# Patient Record
Sex: Male | Born: 1980 | Race: Black or African American | Hispanic: No | Marital: Single | Smoking: Current every day smoker
Health system: Southern US, Community
[De-identification: ages and names within clinical notes are randomized; demographics above are authoritative.]

## PROBLEM LIST (undated history)

## (undated) DIAGNOSIS — J45909 Unspecified asthma, uncomplicated: Secondary | ICD-10-CM

## (undated) DIAGNOSIS — F319 Bipolar disorder, unspecified: Secondary | ICD-10-CM

## (undated) HISTORY — PX: LUNG REMOVAL, PARTIAL: SHX233

## (undated) HISTORY — PX: LUNG SURGERY: SHX703

---
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
## 2010-04-10 ENCOUNTER — Ambulatory Visit: Payer: Self-pay | Admitting: Psychiatry

## 2010-04-10 ENCOUNTER — Other Ambulatory Visit: Payer: Self-pay | Admitting: Emergency Medicine

## 2010-04-10 ENCOUNTER — Inpatient Hospital Stay (HOSPITAL_COMMUNITY): Admission: AD | Admit: 2010-04-10 | Discharge: 2010-04-12 | Payer: Self-pay | Admitting: Psychiatry

## 2010-04-18 ENCOUNTER — Emergency Department (HOSPITAL_COMMUNITY): Admission: EM | Admit: 2010-04-18 | Discharge: 2010-04-18 | Payer: Self-pay | Admitting: Emergency Medicine

## 2010-05-28 ENCOUNTER — Ambulatory Visit: Payer: Self-pay | Admitting: Psychiatry

## 2010-09-26 LAB — CBC
HCT: 43.3 % (ref 39.0–52.0)
MCV: 90.7 fL (ref 78.0–100.0)
RDW: 13.6 % (ref 11.5–15.5)
WBC: 8.5 10*3/uL (ref 4.0–10.5)

## 2010-09-26 LAB — DIFFERENTIAL
Basophils Absolute: 0.1 10*3/uL (ref 0.0–0.1)
Eosinophils Relative: 1 % (ref 0–5)
Lymphocytes Relative: 27 % (ref 12–46)
Monocytes Absolute: 0.7 10*3/uL (ref 0.1–1.0)

## 2010-09-26 LAB — POCT I-STAT, CHEM 8
BUN: 9 mg/dL (ref 6–23)
Calcium, Ion: 1.14 mmol/L (ref 1.12–1.32)
Creatinine, Ser: 1.1 mg/dL (ref 0.4–1.5)
TCO2: 28 mmol/L (ref 0–100)

## 2010-09-26 LAB — GLUCOSE, CAPILLARY
Glucose-Capillary: 97 mg/dL (ref 70–99)
Glucose-Capillary: 98 mg/dL (ref 70–99)

## 2010-09-26 LAB — RAPID URINE DRUG SCREEN, HOSP PERFORMED
Barbiturates: NOT DETECTED
Benzodiazepines: NOT DETECTED
Cocaine: NOT DETECTED
Opiates: NOT DETECTED

## 2010-11-28 ENCOUNTER — Emergency Department (HOSPITAL_COMMUNITY)
Admission: EM | Admit: 2010-11-28 | Discharge: 2010-11-28 | Disposition: A | Discharge status: Home / Self Care | Payer: Medicaid - Out of State | Attending: Emergency Medicine | Admitting: Emergency Medicine

## 2010-11-28 DIAGNOSIS — J45909 Unspecified asthma, uncomplicated: Secondary | ICD-10-CM | POA: Insufficient documentation

## 2010-11-28 DIAGNOSIS — Z8659 Personal history of other mental and behavioral disorders: Secondary | ICD-10-CM | POA: Insufficient documentation

## 2010-11-28 DIAGNOSIS — F1010 Alcohol abuse, uncomplicated: Secondary | ICD-10-CM | POA: Insufficient documentation

## 2010-11-28 DIAGNOSIS — F319 Bipolar disorder, unspecified: Secondary | ICD-10-CM | POA: Insufficient documentation

## 2010-11-28 DIAGNOSIS — Z79899 Other long term (current) drug therapy: Secondary | ICD-10-CM | POA: Insufficient documentation

## 2010-11-28 LAB — COMPREHENSIVE METABOLIC PANEL
ALT: 18 U/L (ref 0–53)
AST: 31 U/L (ref 0–37)
CO2: 25 mEq/L (ref 19–32)
Calcium: 9.2 mg/dL (ref 8.4–10.5)
Chloride: 98 mEq/L (ref 96–112)
GFR calc Af Amer: >60 mL/min (ref >60)
GFR calc non Af Amer: >60 mL/min (ref >60)
Sodium: 135 mEq/L (ref 135–145)
Total Bilirubin: 1 mg/dL (ref 0.3–1.2)

## 2010-11-28 LAB — CBC
MCH: 30.1 pg (ref 26.0–34.0)
MCV: 88.3 fL (ref 78.0–100.0)
Platelets: 269 10*3/uL (ref 150–400)
RDW: 12.3 % (ref 11.5–15.5)

## 2010-11-28 LAB — DIFFERENTIAL
Eosinophils Absolute: 0.1 10*3/uL (ref 0.0–0.7)
Eosinophils Relative: 1 % (ref 0–5)
Lymphs Abs: 2.8 10*3/uL (ref 0.7–4.0)
Monocytes Absolute: 0.9 10*3/uL (ref 0.1–1.0)
Monocytes Relative: 10 % (ref 3–12)

## 2011-01-02 ENCOUNTER — Emergency Department (HOSPITAL_COMMUNITY)
Admission: EM | Admit: 2011-01-02 | Discharge: 2011-01-03 | Disposition: A | Discharge status: Home / Self Care | Payer: Medicare Other | Attending: Emergency Medicine | Admitting: Emergency Medicine

## 2011-01-02 DIAGNOSIS — R42 Dizziness and giddiness: Secondary | ICD-10-CM | POA: Insufficient documentation

## 2011-01-02 DIAGNOSIS — J45909 Unspecified asthma, uncomplicated: Secondary | ICD-10-CM | POA: Insufficient documentation

## 2011-01-03 LAB — CBC
Hemoglobin: 15.1 g/dL (ref 13.0–17.0)
MCHC: 35.1 g/dL (ref 30.0–36.0)
RDW: 12.2 % (ref 11.5–15.5)

## 2011-01-03 LAB — DIFFERENTIAL
Basophils Absolute: 0 10*3/uL (ref 0.0–0.1)
Basophils Relative: 0 % (ref 0–1)
Monocytes Absolute: 1 10*3/uL (ref 0.1–1.0)
Neutro Abs: 5.1 10*3/uL (ref 1.7–7.7)

## 2013-04-09 ENCOUNTER — Encounter (HOSPITAL_COMMUNITY): Payer: Self-pay | Admitting: Emergency Medicine

## 2013-04-09 ENCOUNTER — Emergency Department (HOSPITAL_COMMUNITY)
Admission: EM | Admit: 2013-04-09 | Discharge: 2013-04-09 | Discharge status: Against Medical Advice | Payer: Medicare Other | Attending: Emergency Medicine | Admitting: Emergency Medicine

## 2013-04-09 DIAGNOSIS — Z880 Allergy status to penicillin: Secondary | ICD-10-CM | POA: Insufficient documentation

## 2013-04-09 DIAGNOSIS — J069 Acute upper respiratory infection, unspecified: Secondary | ICD-10-CM | POA: Insufficient documentation

## 2013-04-09 DIAGNOSIS — R079 Chest pain, unspecified: Secondary | ICD-10-CM | POA: Insufficient documentation

## 2013-04-09 MED ORDER — ALBUTEROL SULFATE HFA 108 (90 BASE) MCG/ACT IN AERS
2.0000 | INHALATION_SPRAY | RESPIRATORY_TRACT | Status: DC | PRN
  Administered 2013-04-09: 2 via RESPIRATORY_TRACT
  Filled 2013-04-09: qty 6.7

## 2013-04-09 NOTE — ED Notes (Signed)
Patient states "I feel better I am ready to go, I dont want any more test done on me".  Patient made aware of risk leaving against medical advice and agreed to still leaving.  Patient stable at discharge.

## 2013-04-09 NOTE — ED Provider Notes (Signed)
CSN: [id number]     Arrival date & time 04/09/13  1142 History   First MD Initiated Contact with Patient 04/09/13 1145     Chief Complaint  Patient presents with  . Chest Pain   (Consider location/radiation/quality/duration/timing/severity/associated sxs/prior Treatment) Patient is a 32 y.o. male presenting with chest pain. The history is provided by the patient.  Chest Pain Pain location:  L chest Associated symptoms: cough   Associated symptoms: no abdominal pain, no back pain, no headache, no nausea, no numbness, no shortness of breath, not vomiting and no weakness    patient developed left-sided chest pain while running today. EMS was called and the pain improved with nitroglycerin. It is dull on the left side. His heart rate was going fast but states it was going fast because the run. No fevers. He's had a cough with some green sputum. He does run normally does not have a problem with it. No weight loss. He states he previously had surgery on his lung when he was a child. He states he was a little sweaty. Patient states he was wheezing also  History reviewed. No pertinent past medical history. History reviewed. No pertinent past surgical history. No family history on file. History  Substance Use Topics  . Smoking status: Never Smoker   . Smokeless tobacco: Not on file  . Alcohol Use: No    Review of Systems  Constitutional: Negative for activity change and appetite change.  HENT: Negative for neck stiffness.   Eyes: Negative for pain.  Respiratory: Positive for cough. Negative for chest tightness and shortness of breath.   Cardiovascular: Positive for chest pain. Negative for leg swelling.  Gastrointestinal: Negative for nausea, vomiting, abdominal pain and diarrhea.  Genitourinary: Negative for flank pain.  Musculoskeletal: Negative for back pain.  Skin: Negative for rash.  Neurological: Negative for weakness, numbness and headaches.  Psychiatric/Behavioral: Negative for  behavioral problems.    Allergies  Penicillins  Home Medications  No current outpatient prescriptions on file. BP 105/72  Temp(Src) 98.5 F (36.9 C) (Oral)  Resp 16  SpO2 100% Physical Exam  Nursing note and vitals reviewed. Constitutional: He is oriented to person, place, and time. He appears well-developed and well-nourished.  HENT:  Head: Normocephalic and atraumatic.  Eyes: EOM are normal. Pupils are equal, round, and reactive to light.  Neck: Normal range of motion. Neck supple.  Cardiovascular: Normal rate, regular rhythm and normal heart sounds.   No murmur heard. Pulmonary/Chest: Effort normal. He has wheezes.  Mild wheezes on right. More severe wheezes with harshness on the left.  Abdominal: Soft. Bowel sounds are normal. He exhibits no distension and no mass. There is no tenderness. There is no rebound and no guarding.  Musculoskeletal: Normal range of motion. He exhibits no edema.  Neurological: He is alert and oriented to person, place, and time. No cranial nerve deficit.  Skin: Skin is warm and dry.  Psychiatric: He has a normal mood and affect.    ED Course  Procedures (including critical care time) Labs Review Labs Reviewed  POCT I-STAT TROPONIN I   Imaging Review No results found.  Date: 04/09/2013  Rate: 55  Rhythm: sinus bradycardia  QRS Axis: normal  Intervals: normal  ST/T Wave abnormalities: early repolarization  Conduction Disutrbances:LVH with repol  Narrative Interpretation:   Old EKG Reviewed: none available   MDM   1. Chest pain   2. URI (upper respiratory infection)     patient with chest pain. It does have  some wheezes and focal lung sounds. Patient is not willing to stay for the x-ray. His leaving AMA. Patient was informed of the risks.    Juliet Rude. Rubin Payor, MD 04/09/13 [id number]

## 2013-04-09 NOTE — ED Notes (Signed)
Patient from home via [facility] c/o 10/10 left sided chest pain that radiates to his back and shoulder blades.  Patient's chest is tender to touch.  Patient received 2 nitro and 324 of asprin PTA patient now c/o 5/10 chest pain. Patient states having a productive cough but denies fever.

## 2013-06-13 ENCOUNTER — Encounter (HOSPITAL_COMMUNITY): Payer: Self-pay | Admitting: Emergency Medicine

## 2013-06-13 ENCOUNTER — Emergency Department (HOSPITAL_COMMUNITY)
Admission: EM | Admit: 2013-06-13 | Discharge: 2013-06-13 | Disposition: A | Discharge status: Home / Self Care | Payer: Medicare Other | Attending: Emergency Medicine | Admitting: Emergency Medicine

## 2013-06-13 DIAGNOSIS — Z880 Allergy status to penicillin: Secondary | ICD-10-CM | POA: Insufficient documentation

## 2013-06-13 DIAGNOSIS — Z202 Contact with and (suspected) exposure to infections with a predominantly sexual mode of transmission: Secondary | ICD-10-CM | POA: Insufficient documentation

## 2013-06-13 DIAGNOSIS — R369 Urethral discharge, unspecified: Secondary | ICD-10-CM | POA: Insufficient documentation

## 2013-06-13 DIAGNOSIS — Z792 Long term (current) use of antibiotics: Secondary | ICD-10-CM | POA: Insufficient documentation

## 2013-06-13 DIAGNOSIS — Z711 Person with feared health complaint in whom no diagnosis is made: Secondary | ICD-10-CM

## 2013-06-13 LAB — URINALYSIS, ROUTINE W REFLEX MICROSCOPIC
Bilirubin Urine: NEGATIVE
Glucose, UA: NEGATIVE mg/dL
Hgb urine dipstick: NEGATIVE
Ketones, ur: NEGATIVE mg/dL
Nitrite: NEGATIVE
Protein, ur: NEGATIVE mg/dL
Specific Gravity, Urine: 1.019 (ref 1.005–1.030)
Urobilinogen, UA: 1 mg/dL (ref 0.0–1.0)
pH: 7.5 (ref 5.0–8.0)

## 2013-06-13 LAB — URINE MICROSCOPIC-ADD ON

## 2013-06-13 MED ORDER — METHOCARBAMOL 500 MG PO TABS: 500.0000 mg | ORAL_TABLET | Freq: Two times a day (BID) | ORAL | Status: DC

## 2013-06-13 MED ORDER — DOXYCYCLINE HYCLATE 100 MG PO CAPS: 100.0000 mg | ORAL_CAPSULE | Freq: Two times a day (BID) | ORAL | Status: DC

## 2013-06-13 MED ORDER — CEFTRIAXONE SODIUM 250 MG IJ SOLR
250.0000 mg | Freq: Once | INTRAMUSCULAR | Status: AC
  Administered 2013-06-13: 250 mg via INTRAMUSCULAR
  Filled 2013-06-13: qty 250

## 2013-06-13 MED ORDER — AZITHROMYCIN 250 MG PO TABS
1000.0000 mg | ORAL_TABLET | Freq: Once | ORAL | Status: AC
  Administered 2013-06-13: 1000 mg via ORAL
  Filled 2013-06-13: qty 4

## 2013-06-13 MED ORDER — IBUPROFEN 800 MG PO TABS: 800.0000 mg | ORAL_TABLET | Freq: Three times a day (TID) | ORAL | Status: DC

## 2013-06-13 NOTE — ED Notes (Signed)
Pt states he does not want doxycycline, he states he wants a shot of rocephin, states has had this before with not allergic reaction. Pt also requesting a pain medication. EDPA made aware.

## 2013-06-13 NOTE — ED Notes (Signed)
Pt. reports dysuria with left lower back pain for several days , denies injury or hematuria . No fever or chills.

## 2013-06-13 NOTE — ED Provider Notes (Signed)
CSN: [id number]     Arrival date & time 06/13/13  2041 History  This chart was scribed for non-physician practitioner, Dierdre Forth, PA-C working with Roney Marion, MD by Greggory Stallion, ED scribe. This patient was seen in room TR07C/TR07C and the patient's care was started at 10:34 PM.   Chief Complaint  Patient presents with  . Dysuria   The history is provided by the patient. No language interpreter was used.   HPI Comments: Gene Rivera is a 32 y.o. male who presents to the Emergency Department complaining of dysuria that started several days ago. Pt had unprotected sex with a new male partner girl 2 days ago. 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 He has tried no OTC treatments for his back pain.  Denies abdominal pain, nausea, emesis, testicular pain, penile pain, hematuria, saddle anesthesia, weakness or numbness in his legs, gait disturbance.    History reviewed. No pertinent past medical history. Past Surgical History  Procedure Laterality Date  . Lung surgery     No family history on file. History  Substance Use Topics  . Smoking status: Never Smoker   . Smokeless tobacco: Not on file  . Alcohol Use: No    Review of Systems  Constitutional: Negative for fever, chills, diaphoresis, appetite change, fatigue and unexpected weight change.  HENT: Negative for mouth sores.   Eyes: Negative for visual disturbance.  Respiratory: Negative for cough, chest tightness, shortness of breath and wheezing.   Cardiovascular: Negative for chest pain.  Gastrointestinal: Negative for nausea, vomiting, abdominal pain, diarrhea and constipation.  Endocrine: Negative for polydipsia, polyphagia and polyuria.  Genitourinary: Positive for dysuria and discharge. Negative for urgency, frequency, hematuria, penile pain and testicular pain.  Musculoskeletal: Negative for back pain and neck stiffness.  Skin:  Negative for rash.  Allergic/Immunologic: Negative for immunocompromised state.  Neurological: Negative for syncope, light-headedness and headaches.  Hematological: Does not bruise/bleed easily.  Psychiatric/Behavioral: Negative for sleep disturbance. The patient is not nervous/anxious.     Allergies  Penicillins  Home Medications   Current Outpatient Rx  Name  Route  Sig  Dispense  Refill  . doxycycline (VIBRAMYCIN) 100 MG capsule   Oral   Take 1 capsule (100 mg total) by mouth 2 (two) times daily.   20 capsule   0     BP 115/66  Pulse 62  Temp(Src) 98.5 F (36.9 C) (Oral)  Resp 20  Wt 152 lb (68.947 kg)  SpO2 100%  Physical Exam  Nursing note and vitals reviewed. Constitutional: He appears well-developed and well-nourished. No distress.  Awake, alert, nontoxic appearance  HENT:  Head: Normocephalic and atraumatic.  Mouth/Throat: Oropharynx is clear and moist. No oropharyngeal exudate.  Eyes: Conjunctivae are normal. No scleral icterus.  Neck: Normal range of motion. Neck supple.  Full ROM without pain  Cardiovascular: Normal rate, regular rhythm and intact distal pulses.   Pulmonary/Chest: Effort normal and breath sounds normal. No respiratory distress. He has no wheezes.  Abdominal: Soft. Bowel sounds are normal. He exhibits no distension and no mass. There is no tenderness. There is no rebound and no guarding. Hernia confirmed negative in the right inguinal area and confirmed negative in the left inguinal area.  Genitourinary: Testes normal. Right testis shows no mass, no swelling and no tenderness. Right testis is descended. Cremasteric reflex is not absent on the right side. Left testis shows no mass,  no swelling and no tenderness. Left testis is descended. Cremasteric reflex is not absent on the left side. Uncircumcised. No phimosis, paraphimosis, hypospadias, penile erythema or penile tenderness. Discharge (scant amount, opaque) found.  Musculoskeletal: Normal  range of motion. He exhibits no edema.  Full range of motion of the T-spine and L-spine No tenderness to palpation of the spinous processes of the T-spine or L-spine Mild tenderness to palpation of the paraspinous muscles of the T-spine  Lymphadenopathy:    He has no cervical adenopathy.       Right: No inguinal adenopathy present.       Left: No inguinal adenopathy present.  Neurological: He is alert. He has normal reflexes.  Speech is clear and goal oriented, follows commands Normal strength in upper and lower extremities bilaterally including dorsiflexion and plantar flexion, strong and equal grip strength Sensation normal to light and sharp touch Moves extremities without ataxia, coordination intact Normal gait Normal balance   Skin: Skin is warm and dry. No rash noted. He is not diaphoretic. No erythema.  Psychiatric: He has a normal mood and affect.   ED Course  Procedures (including critical care time)  DIAGNOSTIC STUDIES: Oxygen Saturation is 100% on RA, normal by my interpretation.    COORDINATION OF CARE: 10:37 PM-Discussed treatment plan which includes an antibiotic and ibuprofen with pt at bedside and pt agreed to plan.   Labs Review Labs Reviewed  URINALYSIS, ROUTINE W REFLEX MICROSCOPIC - Abnormal; Notable for the following:    Leukocytes, UA MODERATE (*)    All other components within normal limits  URINE MICROSCOPIC-ADD ON - Abnormal; Notable for the following:    Casts HYALINE CASTS (*)    All other components within normal limits  URINE CULTURE  GC/CHLAMYDIA PROBE AMP   Imaging Review No results found.  EKG Interpretation   None       MDM   1. Exposure to STD   2. Concern about STD in male without diagnosis      [name] presents with dysuria after unprotected sex with a new partner.  Presents for STD screen and dysuria.  STD cultures obtained including gonorrhea Chlamydia. Patient to be discharged with instructions to follow up with  PCP. Discussed importance of using protection when sexually active. Pt understands that they have GC/Chlamydia cultures pending and that they will need to inform all sexual partners if results return positive. Patient has been treated prophylactically with azithromycin. Patient with history of anaphylactic reaction to penicillin but states he's had Rocephin IM without any difficulty. Will minister here in the emergency department. Pt has been advised to not drink on this medication. I have also discussed reasons to return immediately to the ER. Patient expresses understanding and agrees with plan.  Patient also with back pain.  No neurological deficits and normal neuro exam.  Patient can walk but states is painful.  No loss of bowel or bladder control.  No concern for cauda equina.  No fever, night sweats, weight loss, h/o cancer, IVDU.  RICE protocol and pain medicine indicated and discussed with patient.   It has been determined that no acute conditions requiring further emergency intervention are present at this time. The patient/guardian have been advised of the diagnosis and plan. We have discussed signs and symptoms that warrant return to the ED, such as changes or worsening in symptoms.   Vital signs are stable at discharge.   BP 115/66  Pulse 62  Temp(Src) 98.5 F (36.9 C) (Oral)  Resp 20  Wt 152 lb (68.947 kg)  SpO2 100%  Patient/guardian has voiced understanding and agreed to follow-up with the PCP or specialist.    I personally performed the services described in this documentation, which was scribed in my presence. The recorded information has been reviewed and is accurate.    Dahlia Client Baylie Drakes, PA-C 06/13/13 2252  Dierdre Forth, PA-C 06/13/13 2302

## 2013-06-14 LAB — GC/CHLAMYDIA PROBE AMP
CT Probe RNA: NEGATIVE
GC Probe RNA: NEGATIVE

## 2013-06-15 LAB — URINE CULTURE: Culture: NO GROWTH

## 2013-06-23 NOTE — ED Provider Notes (Signed)
Medical screening examination/treatment/procedure(s) were performed by non-physician practitioner and as supervising physician I was immediately available for consultation/collaboration.  EKG Interpretation   None         Janayia Burggraf J Arben Packman, MD 06/23/13 0839 

## 2013-10-06 DIAGNOSIS — Z9119 Patient's noncompliance with other medical treatment and regimen: Secondary | ICD-10-CM | POA: Diagnosis not present

## 2013-10-06 DIAGNOSIS — F17200 Nicotine dependence, unspecified, uncomplicated: Secondary | ICD-10-CM | POA: Diagnosis present

## 2013-10-06 DIAGNOSIS — R45851 Suicidal ideations: Secondary | ICD-10-CM | POA: Diagnosis not present

## 2013-10-06 DIAGNOSIS — Z91199 Patient's noncompliance with other medical treatment and regimen due to unspecified reason: Secondary | ICD-10-CM | POA: Diagnosis not present

## 2013-10-06 DIAGNOSIS — IMO0002 Reserved for concepts with insufficient information to code with codable children: Secondary | ICD-10-CM | POA: Diagnosis not present

## 2013-10-06 DIAGNOSIS — F332 Major depressive disorder, recurrent severe without psychotic features: Secondary | ICD-10-CM | POA: Diagnosis not present

## 2013-10-06 DIAGNOSIS — F259 Schizoaffective disorder, unspecified: Secondary | ICD-10-CM | POA: Diagnosis present

## 2014-01-11 DIAGNOSIS — F331 Major depressive disorder, recurrent, moderate: Secondary | ICD-10-CM | POA: Diagnosis not present

## 2014-01-18 ENCOUNTER — Encounter (HOSPITAL_COMMUNITY): Payer: Self-pay | Admitting: Emergency Medicine

## 2014-01-18 ENCOUNTER — Emergency Department (HOSPITAL_COMMUNITY)
Admission: EM | Admit: 2014-01-18 | Discharge: 2014-01-18 | Disposition: A | Discharge status: Home / Self Care | Payer: Medicare Other | Attending: Emergency Medicine | Admitting: Emergency Medicine

## 2014-01-18 DIAGNOSIS — S79919A Unspecified injury of unspecified hip, initial encounter: Secondary | ICD-10-CM | POA: Diagnosis not present

## 2014-01-18 DIAGNOSIS — Z791 Long term (current) use of non-steroidal anti-inflammatories (NSAID): Secondary | ICD-10-CM | POA: Insufficient documentation

## 2014-01-18 DIAGNOSIS — Z79899 Other long term (current) drug therapy: Secondary | ICD-10-CM | POA: Diagnosis not present

## 2014-01-18 DIAGNOSIS — Z880 Allergy status to penicillin: Secondary | ICD-10-CM | POA: Diagnosis not present

## 2014-01-18 DIAGNOSIS — R031 Nonspecific low blood-pressure reading: Secondary | ICD-10-CM | POA: Diagnosis not present

## 2014-01-18 DIAGNOSIS — Y990 Civilian activity done for income or pay: Secondary | ICD-10-CM | POA: Diagnosis not present

## 2014-01-18 DIAGNOSIS — S79929A Unspecified injury of unspecified thigh, initial encounter: Principal | ICD-10-CM

## 2014-01-18 DIAGNOSIS — Z792 Long term (current) use of antibiotics: Secondary | ICD-10-CM | POA: Diagnosis not present

## 2014-01-18 DIAGNOSIS — Y929 Unspecified place or not applicable: Secondary | ICD-10-CM | POA: Insufficient documentation

## 2014-01-18 DIAGNOSIS — M25559 Pain in unspecified hip: Secondary | ICD-10-CM | POA: Diagnosis not present

## 2014-01-18 DIAGNOSIS — X500XXA Overexertion from strenuous movement or load, initial encounter: Secondary | ICD-10-CM | POA: Insufficient documentation

## 2014-01-18 DIAGNOSIS — Y9301 Activity, walking, marching and hiking: Secondary | ICD-10-CM | POA: Diagnosis not present

## 2014-01-18 DIAGNOSIS — M25552 Pain in left hip: Secondary | ICD-10-CM

## 2014-01-18 MED ORDER — IBUPROFEN 800 MG PO TABS: 800.0000 mg | ORAL_TABLET | Freq: Three times a day (TID) | ORAL | Status: DC

## 2014-01-18 MED ORDER — HYDROCODONE-ACETAMINOPHEN 5-325 MG PO TABS: 1.0000 | ORAL_TABLET | ORAL | Status: DC | PRN

## 2014-01-18 MED ORDER — CYCLOBENZAPRINE HCL 10 MG PO TABS: 10.0000 mg | ORAL_TABLET | Freq: Two times a day (BID) | ORAL | Status: DC | PRN

## 2014-01-18 NOTE — ED Notes (Signed)
Pt states he slipped in the walk in cooler at work this AM. Pt states he caught himself falling. Pt c/o pain to L hip. Pt states pain is getting worse. Pt ambulatory to exam room with steady gait.

## 2014-01-18 NOTE — Discharge Instructions (Signed)
Arthralgia Arthralgia is joint pain. A joint is a place where two bones meet. Joint pain can happen for many reasons. The joint can be bruised, stiff, infected, or weak from aging. Pain usually goes away after resting and taking medicine for soreness.  HOME CARE  Rest the joint as told by your doctor.  Keep the sore joint raised (elevated) for the first 24 hours.  Put ice on the joint area.  Put ice in a plastic bag.  Place a towel between your skin and the bag.  Leave the ice on for 15-20 minutes, 03-04 times a day.  Wear your splint, casting, elastic bandage, or sling as told by your doctor.  Only take medicine as told by your doctor. Do not take aspirin.  Use crutches as told by your doctor. Do not put weight on the joint until told to by your doctor. GET HELP RIGHT AWAY IF:   You have bruising, puffiness (swelling), or more pain.  Your fingers or toes turn blue or start to lose feeling (numb).  Your medicine does not lessen the pain.  Your pain becomes severe.  You have a temperature by mouth above 102 F (38.9 C), not controlled by medicine.  You cannot move or use the joint. MAKE SURE YOU:   Understand these instructions.  Will watch your condition.  Will get help right away if you are not doing well or get worse. Document Released: 06/18/2009 Document Revised: 09/22/2011 Document Reviewed: 06/18/2009 [facility] Patient Information 2015 [place], [place]. This information is not intended to replace advice given to you by your health care provider. Make sure you discuss any questions you have with your health care provider. Cryotherapy Cryotherapy means treatment with cold. Ice or gel packs can be used to reduce both pain and swelling. Ice is the most helpful within the first 24 to 48 hours after an injury or flareup from overusing a muscle or joint. Sprains, strains, spasms, burning pain, shooting pain, and aches can all be eased with ice. Ice can also be used when  recovering from surgery. Ice is effective, has very few side effects, and is safe for most people to use. PRECAUTIONS  Ice is not a safe treatment option for people with:  Raynaud's phenomenon. This is a condition affecting small blood vessels in the extremities. Exposure to cold may cause your problems to return.  Cold hypersensitivity. There are many forms of cold hypersensitivity, including:  Cold urticaria. Red, itchy hives appear on the skin when the tissues begin to warm after being iced.  Cold erythema. This is a red, itchy rash caused by exposure to cold.  Cold hemoglobinuria. Red blood cells break down when the tissues begin to warm after being iced. The hemoglobin that carry oxygen are passed into the urine because they cannot combine with blood proteins fast enough.  Numbness or altered sensitivity in the area being iced. If you have any of the following conditions, do not use ice until you have discussed cryotherapy with your caregiver:  Heart conditions, such as arrhythmia, angina, or chronic heart disease.  High blood pressure.  Healing wounds or open skin in the area being iced.  Current infections.  Rheumatoid arthritis.  Poor circulation.  Diabetes. Ice slows the blood flow in the region it is applied. This is beneficial when trying to stop inflamed tissues from spreading irritating chemicals to surrounding tissues. However, if you expose your skin to cold temperatures for too long or without the proper protection, you can damage  your skin or nerves. Watch for signs of skin damage due to cold. HOME CARE INSTRUCTIONS Follow these tips to use ice and cold packs safely.  Place a dry or damp towel between the ice and skin. A damp towel will cool the skin more quickly, so you may need to shorten the time that the ice is used.  For a more rapid response, add gentle compression to the ice.  Ice for no more than 10 to 20 minutes at a time. The bonier the area you are  icing, the less time it will take to get the benefits of ice.  Check your skin after 5 minutes to make sure there are no signs of a poor response to cold or skin damage.  Rest 20 minutes or more in between uses.  Once your skin is numb, you can end your treatment. You can test numbness by very lightly touching your skin. The touch should be so light that you do not see the skin dimple from the pressure of your fingertip. When using ice, most people will feel these normal sensations in this order: cold, burning, aching, and numbness.  Do not use ice on someone who cannot communicate their responses to pain, such as small children or people with dementia. HOW TO MAKE AN ICE PACK Ice packs are the most common way to use ice therapy. Other methods include ice massage, ice baths, and cryo-sprays. Muscle creams that cause a cold, tingly feeling do not offer the same benefits that ice offers and should not be used as a substitute unless recommended by your caregiver. To make an ice pack, do one of the following:  Place crushed ice or a bag of frozen vegetables in a sealable plastic bag. Squeeze out the excess air. Place this bag inside another plastic bag. Slide the bag into a pillowcase or place a damp towel between your skin and the bag.  Mix 3 parts water with 1 part rubbing alcohol. Freeze the mixture in a sealable plastic bag. When you remove the mixture from the freezer, it will be slushy. Squeeze out the excess air. Place this bag inside another plastic bag. Slide the bag into a pillowcase or place a damp towel between your skin and the bag. SEEK MEDICAL CARE IF:  You develop white spots on your skin. This may give the skin a blotchy (mottled) appearance.  Your skin turns blue or pale.  Your skin becomes waxy or hard.  Your swelling gets worse. MAKE SURE YOU:   Understand these instructions.  Will watch your condition.  Will get help right away if you are not doing well or get  worse. Document Released: 02/24/2011 Document Revised: 09/22/2011 Document Reviewed: 02/24/2011 [facility] Patient Information 2015 [place], [place]. This information is not intended to replace advice given to you by your health care provider. Make sure you discuss any questions you have with your health care provider.

## 2014-01-18 NOTE — ED Provider Notes (Signed)
CSN: [id number]     Arrival date & time 01/18/14  2111 History  This chart was scribed for non-physician practitioner, Elpidio AnisShari Dayson Aboud, PA-C,working with Juliet RudeNathan R. Rubin PayorPickering, MD, by Karle PlumberJennifer Tensley, ED Scribe.  This patient was seen in room WTR7/WTR7 and the patient's care was started at 9:31 PM.  Chief Complaint  Patient presents with  . Hip Pain   The history is provided by the patient. No language interpreter was used.   HPI Comments:  Gene Rivera is a 33 y.o. male who presents to the Emergency Department complaining of moderate left hip pain that started secondary to slipping and almost falling last night. He states he feels as if he pulled a muscle. He has not taken anything for pain. He denies numbness or abdominal pain. He denies fall, trauma, or other injury.   History reviewed. No pertinent past medical history. Past Surgical History  Procedure Laterality Date  . Lung surgery     No family history on file. History  Substance Use Topics  . Smoking status: Never Smoker   . Smokeless tobacco: Not on file  . Alcohol Use: No    Review of Systems  Gastrointestinal: Negative for abdominal pain.  Musculoskeletal: Positive for myalgias.  Neurological: Negative for numbness.  All other systems reviewed and are negative.   Allergies  Penicillins  Home Medications   Prior to Admission medications   Medication Sig Start Date End Date Taking? Authorizing Provider  doxycycline (VIBRAMYCIN) 100 MG capsule Take 1 capsule (100 mg total) by mouth 2 (two) times daily. 06/13/13   Hannah Muthersbaugh, PA-C  ibuprofen (ADVIL,MOTRIN) 800 MG tablet Take 1 tablet (800 mg total) by mouth 3 (three) times daily. 06/13/13   Hannah Muthersbaugh, PA-C  methocarbamol (ROBAXIN) 500 MG tablet Take 1 tablet (500 mg total) by mouth 2 (two) times daily. 06/13/13   Hannah Muthersbaugh, PA-C   Triage Vitals: BP 100/46  Pulse 70  Temp(Src) 98.9 F (37.2 C) (Oral)  Resp 16  SpO2 97% Physical Exam   Nursing note and vitals reviewed. Constitutional: He is oriented to person, place, and time. He appears well-developed and well-nourished.  HENT:  Head: Normocephalic and atraumatic.  Eyes: EOM are normal.  Neck: Normal range of motion.  Cardiovascular: Normal rate.   Pulmonary/Chest: Effort normal.  Abdominal: Soft. There is no tenderness.  Musculoskeletal: Normal range of motion. He exhibits tenderness. He exhibits no edema.  Left paralumbar tenderness. No swelling. No discoloration.  Neurological: He is alert and oriented to person, place, and time. He displays normal reflexes.  Skin: Skin is warm and dry.  Psychiatric: He has a normal mood and affect. His behavior is normal.    ED Course  Procedures (including critical care time) DIAGNOSTIC STUDIES: Oxygen Saturation is 97% on RA, normal by my interpretation.   COORDINATION OF CARE: 9:33 PM- Will treat pain. Pt verbalizes understanding and agrees to plan.  Medications - No data to display  Labs Review Labs Reviewed - No data to display  Imaging Review No results found.   EKG Interpretation None      MDM   Final diagnoses:  None    1. Musculoskeletal hip pain  No concern for fracture without traumatic injury. Tender to palpation, ambulatory with full weight bearing.   I personally performed the services described in this documentation, which was scribed in my presence. The recorded information has been reviewed and is accurate.    Arnoldo HookerShari A Ilayda Toda, PA-C 01/18/14 2204

## 2014-01-18 NOTE — ED Provider Notes (Signed)
Medical screening examination/treatment/procedure(s) were performed by non-physician practitioner and as supervising physician I was immediately available for consultation/collaboration.   EKG Interpretation None       Juliet RudeNathan R. Rubin PayorPickering, MD 01/18/14 [id number]

## 2014-02-22 ENCOUNTER — Encounter (HOSPITAL_COMMUNITY): Payer: Self-pay | Admitting: Emergency Medicine

## 2014-02-22 DIAGNOSIS — Z880 Allergy status to penicillin: Secondary | ICD-10-CM | POA: Diagnosis not present

## 2014-02-22 DIAGNOSIS — R1013 Epigastric pain: Secondary | ICD-10-CM | POA: Diagnosis not present

## 2014-02-22 DIAGNOSIS — K859 Acute pancreatitis without necrosis or infection, unspecified: Secondary | ICD-10-CM | POA: Insufficient documentation

## 2014-02-22 DIAGNOSIS — R109 Unspecified abdominal pain: Secondary | ICD-10-CM | POA: Diagnosis not present

## 2014-02-22 DIAGNOSIS — F17200 Nicotine dependence, unspecified, uncomplicated: Secondary | ICD-10-CM | POA: Insufficient documentation

## 2014-02-22 LAB — COMPREHENSIVE METABOLIC PANEL
ALT: 20 U/L (ref 0–53)
AST: 29 U/L (ref 0–37)
Albumin: 3.7 g/dL (ref 3.5–5.2)
Alkaline Phosphatase: 50 U/L (ref 39–117)
Anion gap: 16 — ABNORMAL HIGH (ref 5–15)
BILIRUBIN TOTAL: 0.6 mg/dL (ref 0.3–1.2)
BUN: 12 mg/dL (ref 6–23)
CHLORIDE: 100 meq/L (ref 96–112)
CO2: 24 mEq/L (ref 19–32)
CREATININE: 0.99 mg/dL (ref 0.50–1.35)
Calcium: 8.7 mg/dL (ref 8.4–10.5)
GLUCOSE: 131 mg/dL — AB (ref 70–99)
Potassium: 4.4 mEq/L (ref 3.7–5.3)
Sodium: 140 mEq/L (ref 137–147)
Total Protein: 7.2 g/dL (ref 6.0–8.3)

## 2014-02-22 LAB — CBC WITH DIFFERENTIAL/PLATELET
BASOS ABS: 0 10*3/uL (ref 0.0–0.1)
Basophils Relative: 0 % (ref 0–1)
Eosinophils Absolute: 0 10*3/uL (ref 0.0–0.7)
Eosinophils Relative: 0 % (ref 0–5)
HCT: 41.7 % (ref 39.0–52.0)
HEMOGLOBIN: 13.4 g/dL (ref 13.0–17.0)
LYMPHS PCT: 17 % (ref 12–46)
Lymphs Abs: 1.3 10*3/uL (ref 0.7–4.0)
MCH: 29.2 pg (ref 26.0–34.0)
MCHC: 32.1 g/dL (ref 30.0–36.0)
MCV: 90.8 fL (ref 78.0–100.0)
Monocytes Absolute: 0.6 10*3/uL (ref 0.1–1.0)
Monocytes Relative: 8 % (ref 3–12)
NEUTROS ABS: 5.6 10*3/uL (ref 1.7–7.7)
Neutrophils Relative %: 75 % (ref 43–77)
Platelets: 301 10*3/uL (ref 150–400)
RBC: 4.59 MIL/uL (ref 4.22–5.81)
RDW: 12.4 % (ref 11.5–15.5)
WBC: 7.5 10*3/uL (ref 4.0–10.5)

## 2014-02-22 LAB — LIPASE, BLOOD: Lipase: 167 U/L — ABNORMAL HIGH (ref 11–59)

## 2014-02-22 LAB — I-STAT TROPONIN, ED: Troponin i, poc: 0 ng/mL (ref 0.00–0.08)

## 2014-02-22 NOTE — ED Notes (Addendum)
Pt presents with c/o abdominal pain with emesis x4 today at 0800 and mild dizziness at onset. Pt reports feeling better now, states ate a piece of pizza this evening and did not vomit. Pt reports mild nausea at this time, denies diarrhea. Neuro intact.

## 2014-02-23 ENCOUNTER — Emergency Department (HOSPITAL_COMMUNITY)
Admission: EM | Admit: 2014-02-23 | Discharge: 2014-02-23 | Disposition: A | Discharge status: Home / Self Care | Payer: Medicare Other | Attending: Emergency Medicine | Admitting: Emergency Medicine

## 2014-02-23 ENCOUNTER — Emergency Department (HOSPITAL_COMMUNITY): Payer: Medicare Other

## 2014-02-23 DIAGNOSIS — K859 Acute pancreatitis without necrosis or infection, unspecified: Secondary | ICD-10-CM | POA: Diagnosis not present

## 2014-02-23 DIAGNOSIS — R1013 Epigastric pain: Secondary | ICD-10-CM | POA: Diagnosis not present

## 2014-02-23 LAB — URINALYSIS, ROUTINE W REFLEX MICROSCOPIC
BILIRUBIN URINE: NEGATIVE
Glucose, UA: NEGATIVE mg/dL
HGB URINE DIPSTICK: NEGATIVE
Ketones, ur: 15 mg/dL — AB
Leukocytes, UA: NEGATIVE
Nitrite: NEGATIVE
Protein, ur: NEGATIVE mg/dL
SPECIFIC GRAVITY, URINE: 1.015 (ref 1.005–1.030)
Urobilinogen, UA: 0.2 mg/dL (ref 0.0–1.0)
pH: 5 (ref 5.0–8.0)

## 2014-02-23 MED ORDER — SODIUM CHLORIDE 0.9 % IV BOLUS (SEPSIS)
1000.0000 mL | Freq: Once | INTRAVENOUS | Status: AC
  Administered 2014-02-23: 1000 mL via INTRAVENOUS

## 2014-02-23 MED ORDER — ONDANSETRON HCL 4 MG/2ML IJ SOLN
4.0000 mg | Freq: Once | INTRAMUSCULAR | Status: AC
  Administered 2014-02-23: 4 mg via INTRAVENOUS
  Filled 2014-02-23: qty 2

## 2014-02-23 MED ORDER — MORPHINE SULFATE 4 MG/ML IJ SOLN
4.0000 mg | Freq: Once | INTRAMUSCULAR | Status: AC
Start: 2014-02-23 — End: 2014-02-23
  Administered 2014-02-23: 4 mg via INTRAVENOUS
  Filled 2014-02-23: qty 1

## 2014-02-23 MED ORDER — HYDROCODONE-ACETAMINOPHEN 5-325 MG PO TABS: 2.0000 | ORAL_TABLET | ORAL | Status: DC | PRN

## 2014-02-23 NOTE — ED Provider Notes (Signed)
CSN: [id number]     Arrival date & time 02/22/14  2149 History   First MD Initiated Contact with Patient 02/23/14 0131     Chief Complaint  Patient presents with  . Abdominal Pain  . Emesis     (Consider location/radiation/quality/duration/timing/severity/associated sxs/prior Treatment) HPI Comments: Patient is a 33 year old male with no significant past medical history. He presents with complaints of a several day history of epigastric abdominal pain associated with vomiting and loose stools. He denies any fevers or chills. He does report to me that he drinks approximately 2 cases of beer per week. He denies any surgical history.  Patient is a 33 y.o. male presenting with abdominal pain and vomiting. The history is provided by the patient.  Abdominal Pain Pain location:  Epigastric Pain quality: tearing   Pain radiates to:  Does not radiate Pain severity:  Moderate Onset quality:  Gradual Duration:  2 days Timing:  Constant Progression:  Worsening Chronicity:  New Worsened by:  Nothing tried Ineffective treatments:  None tried Associated symptoms: vomiting   Emesis Associated symptoms: abdominal pain     History reviewed. No pertinent past medical history. Past Surgical History  Procedure Laterality Date  . Lung surgery    . Lobectomy Left 1990   No family history on file. History  Substance Use Topics  . Smoking status: Current Every Day Smoker -- 0.50 packs/day  . Smokeless tobacco: Not on file  . Alcohol Use: No    Review of Systems  Gastrointestinal: Positive for vomiting and abdominal pain.  All other systems reviewed and are negative.     Allergies  Penicillins  Home Medications   Prior to Admission medications   Not on File   BP 128/79  Pulse 66  Temp(Src) 98.4 F (36.9 C) (Oral)  Resp 14  Ht 5\' 9"  (1.753 m)  Wt 150 lb (68.04 kg)  BMI 22.14 kg/m2  SpO2 100% Physical Exam  Nursing note and vitals reviewed. Constitutional: He is oriented to  person, place, and time. He appears well-developed and well-nourished. No distress.  HENT:  Head: Normocephalic and atraumatic.  Mouth/Throat: Oropharynx is clear and moist.  Neck: Normal range of motion. Neck supple.  Cardiovascular: Normal rate, regular rhythm and normal heart sounds.   No murmur heard. Pulmonary/Chest: Effort normal and breath sounds normal. No respiratory distress. He has no wheezes.  Abdominal: Soft. Bowel sounds are normal. He exhibits no distension. There is tenderness. There is no rebound and no guarding.  There is tenderness to palpation in the epigastrium. There is no rebound and no guarding.  Musculoskeletal: Normal range of motion. He exhibits no edema.  Lymphadenopathy:    He has no cervical adenopathy.  Neurological: He is alert and oriented to person, place, and time.  Skin: Skin is warm and dry. He is not diaphoretic.    ED Course  Procedures (including critical care time) Labs Review Labs Reviewed  COMPREHENSIVE METABOLIC PANEL - Abnormal; Notable for the following:    Glucose, Bld 131 (*)    Anion gap 16 (*)    All other components within normal limits  LIPASE, BLOOD - Abnormal; Notable for the following:    Lipase 167 (*)    All other components within normal limits  URINALYSIS, ROUTINE W REFLEX MICROSCOPIC - Abnormal; Notable for the following:    Ketones, ur 15 (*)    All other components within normal limits  CBC WITH DIFFERENTIAL  I-STAT TROPOININ, ED    Imaging Review No  results found.   EKG Interpretation None      MDM   Final diagnoses:  None    Workup reveals an elevated lipase but no acute findings on ultrasound to suggest gallbladder disease. He does report drinking a significant quantity of alcohol with some regularity I suspect that his pancreatitis is alcohol related. I feel as though he is appropriate for discharge. He will be prescribed pain medication and advised to adhere to a clear liquid diet for the next 2 days,  then slowly advance to normal. He understands to return if his symptoms worsen or change.    Geoffery Lyonsouglas Malyah Ohlrich, MD 02/23/14 [phone]

## 2014-02-23 NOTE — ED Notes (Signed)
Dr. Delo at bedside. 

## 2014-02-23 NOTE — Discharge Instructions (Signed)
Hydrocodone as prescribed as needed for pain.  Adhere to a clear liquid diet for the next 2 days, then slowly advance to normal.  Return to the emergency department if you develop high fever, worsening pain, bloody stool, or other new and concerning symptoms.  Refrain from alcohol use as this may be the cause of your pancreatitis.   Acute Pancreatitis Acute pancreatitis is a disease in which the pancreas becomes suddenly inflamed. The pancreas is a large gland located behind your stomach. The pancreas produces enzymes that help digest food. The pancreas also releases the hormones glucagon and insulin that help regulate blood sugar. Damage to the pancreas occurs when the digestive enzymes from the pancreas are activated and begin attacking the pancreas before being released into the intestine. Most acute attacks last a couple of days and can cause serious complications. Some people become dehydrated and develop low blood pressure. In severe cases, bleeding into the pancreas can lead to shock and can be life-threatening. The lungs, heart, and kidneys may fail. CAUSES  Pancreatitis can happen to anyone. In some cases, the cause is unknown. Most cases are caused by:  Alcohol abuse.  Gallstones. Other less common causes are:  Certain medicines.  Exposure to certain chemicals.  Infection.  Damage caused by an accident (trauma).  Abdominal surgery. SYMPTOMS   Pain in the upper abdomen that may radiate to the back.  Tenderness and swelling of the abdomen.  Nausea and vomiting. DIAGNOSIS  Your caregiver will perform a physical exam. Blood and stool tests may be done to confirm the diagnosis. Imaging tests may also be done, such as X-rays, CT scans, or an ultrasound of the abdomen. TREATMENT  Treatment usually requires a stay in the hospital. Treatment may include:  Pain medicine.  Fluid replacement through an intravenous line (IV).  Placing a tube in the stomach to remove stomach  contents and control vomiting.  Not eating for 3 or 4 days. This gives your pancreas a rest, because enzymes are not being produced that can cause further damage.  Antibiotic medicines if your condition is caused by an infection.  Surgery of the pancreas or gallbladder. HOME CARE INSTRUCTIONS   Follow the diet advised by your caregiver. This may involve avoiding alcohol and decreasing the amount of fat in your diet.  Eat smaller, more frequent meals. This reduces the amount of digestive juices the pancreas produces.  Drink enough fluids to keep your urine clear or pale yellow.  Only take over-the-counter or prescription medicines as directed by your caregiver.  Avoid drinking alcohol if it caused your condition.  Do not smoke.  Get plenty of rest.  Check your blood sugar at home as directed by your caregiver.  Keep all follow-up appointments as directed by your caregiver. SEEK MEDICAL CARE IF:   You do not recover as quickly as expected.  You develop new or worsening symptoms.  You have persistent pain, weakness, or nausea.  You recover and then have another episode of pain. SEEK IMMEDIATE MEDICAL CARE IF:   You are unable to eat or keep fluids down.  Your pain becomes severe.  You have a fever or persistent symptoms for more than 2 to 3 days.  You have a fever and your symptoms suddenly get worse.  Your skin or the white part of your eyes turn yellow (jaundice).  You develop vomiting.  You feel dizzy, or you faint.  Your blood sugar is high (over 300 mg/dL). MAKE SURE YOU:   Understand  these instructions.  Will watch your condition.  Will get help right away if you are not doing well or get worse. Document Released: 06/30/2005 Document Revised: 12/30/2011 Document Reviewed: 10/09/2011 [facility] Patient Information 2015 [place], [place]. This information is not intended to replace advice given to you by your health care provider. Make sure you discuss any  questions you have with your health care provider.    Emergency Department Resource Guide 1) Find a Doctor and Pay Out of Pocket Although you won't have to find out who is covered by your insurance plan, it is a good idea to ask around and get recommendations. You will then need to call the office and see if the doctor you have chosen will accept you as a new patient and what types of options they offer for patients who are self-pay. Some doctors offer discounts or will set up payment plans for their patients who do not have insurance, but you will need to ask so you aren't surprised when you get to your appointment.  2) Contact Your Local Health Department Not all health departments have doctors that can see patients for sick visits, but many do, so it is worth a call to see if yours does. If you don't know where your local health department is, you can check in your phone book. The CDC also has a tool to help you locate your state's health department, and many state websites also have listings of all of their local health departments.  3) Find a Walk-in Clinic If your illness is not likely to be very severe or complicated, you may want to try a walk in clinic. These are popping up all over the country in pharmacies, drugstores, and shopping centers. They're usually staffed by nurse practitioners or physician assistants that have been trained to treat common illnesses and complaints. They're usually fairly quick and inexpensive. However, if you have serious medical issues or chronic medical problems, these are probably not your best option.  No Primary Care Doctor: - Call Health Connect at  [phone] - they can help you locate a primary care doctor that  accepts your insurance, provides certain services, etc. - Physician Referral Service- [phone]  Chronic Pain Problems: Organization         Address  Phone   Notes  [facility]  [phone] Patients need to be referred  by their primary care doctor.   Medication Assistance: Organization         Address  Phone   Notes  [facility] Medication [facility] [address] [place] [place], [place] [address] [phone] --Must be a resident of [facility] -- Must have NO insurance coverage whatsoever (no Medicaid/ Medicare, etc.) -- The pt. MUST have a primary care doctor that directs their care regularly and follows them in the community   [facility]ist  [phone]   [employer]  [phone]    Agencies that provide inexpensive medical care: Organization         Address  Phone   Notes  [facility]  [phone]   [facility]    [phone]   [facility] [address] [place], [place] [address] [phone]   [facility] [address] [place] [place] [phone]   Planned Parenthood    [phone]   [facility]    [phone]   [facility]  [address], [place] Phone:  [phone], Fax:  [phone] Hours of Operation:  9 am - 6 pm, M-F.  Also accepts Medicaid/Medicare and self-pay.  [facility]  [address], [place] Phone: [phone], Fax: [phone]. Hours of Operation:  8:30 am - 5:30 pm, M-F.  Also accepts Medicaid and self-pay.  [facility] [place] [address], [place] Phone: [phone]   [facility] [address] Natasha Bence [place], [place] [phone], Ext. [phone] Mondays & Thursdays: 7-9 AM.  First 15 patients are seen on a first come, first serve basis.    Medicaid-accepting [facility] Providers:  Organization         Address  Phone   Notes  [facility] [address], [place] [phone] Also accepts self-pay patients.  [facility] [address] Laurell Josephs [place], [place]  [phone]   [facility] [address], [place] [phone]   [facility] [address] [place] [phone]   Renaye Rakers [address], [place]   [phone] Only accepts [place] Access [place] patients after they have their name applied to their card.   Self-Pay (no insurance) in [facility]:  Organization         Address  Phone   Notes  Sickle Cell Patients, [facility] Internal Medicine [address] [place], [place] [phone]   [facility] Urgent Care [address] [place], [place] [phone]   [facility] [place]  [address], [place] [phone]   [facility]/Dr. Osei-Bonsu  [address] [place] or [address], [place] [phone] Phone number for both [place] and [place] locations is the same.  [facility] [address] [place] [phone]   [facility] [address], [place] or [address] [phone]   [facility] [address], [place] [phone], phone; [phone], fax Sees patients 1st and 3rd Saturday of every month.  Must not qualify for public or private insurance (i.e. Medicaid, Medicare, [place] Health Choice, Veterans' Benefits)  Household income should be no more than 200% of the poverty level The clinic cannot treat you if you are pregnant or think you are pregnant  Sexually transmitted diseases are not treated at the clinic.    Dental Care: Organization         Address  Phone  Notes  [facility] Department of [facility] [address] [place], [place] [phone] Accepts children up to age 62 who are enrolled in [place] or [place] Health Choice; pregnant women with a Medicaid card; and children who have applied for Medicaid or [place] Health Choice, but were declined, whose parents can pay a  reduced fee at time of service.  [facility]  [address], [place] [phone] Accepts children up to age 50 who are enrolled in [place] or [place] Health Choice; pregnant women with a Medicaid card; and children who have applied for Medicaid or [place] Health Choice, but were declined, whose parents can pay a reduced fee at time of service.  [facility] Adult Dental Access PROGRAM  [address] [place], [place] [phone]  Patients are seen by appointment only. Walk-ins are not accepted. [facility] will see patients 58 years of age and older. Monday - Tuesday (8am-5pm) Most Wednesdays (8:30-5pm) $30 per visit, cash only  [facility] Adult Dental Access PROGRAM  [address], [facility] [phone] Patients are seen by appointment only. Walk-ins are not accepted. [facility] will see patients 37 years of age and older. One Wednesday Evening (Monthly: Volunteer Based).  $30 per visit, cash only  [employer]  [phone] for adults; Children under age 52, call Graduate Pediatric Dentistry at [phone]. Children aged 35-14, please call [phone] to request a pediatric application.  Dental services are provided in all areas of dental care including fillings, crowns and bridges, complete and partial dentures, implants, gum treatment, root canals, and extractions. Preventive care is also provided. Treatment is provided to both adults and children. Patients are selected via a lottery and there is often a waiting list.   [facility] [address], [place]  [phone] [email or web address]   [facility] [address] [place], [place] [phone], Ext. [phone] Second and Fourth Thursday of each month, opens at 6:30 AM; Clinic ends at 9 AM.  Patients are seen on a first-come first-served basis, and a limited number are seen during each clinic.   [facility]  [address] Ether [place], [place] [phone]   Eligibility Requirements You must have lived in [place], [place], or [place] counties for at least the last three months.   You cannot be eligible for state or federal sponsored [place], including [employer], [place], or [employer].   You generally cannot be eligible for healthcare insurance through your employer.    How to apply: Eligibility screenings are held every Tuesday and Wednesday afternoon from 1:00 pm until 4:00 pm. You do not need an appointment for the interview!  [facility] [address] [place], [place] [phone]   [facility] Health Department  [phone]   [facility] Health Department  [phone]   [facility] Health Department  [phone]    Behavioral Health Resources in the Community: Intensive Outpatient Programs Organization         Address  Phone  Notes  [facility] Services [address] [place], [place] [phone]   [facility] [address] [place], [place] [phone]   [facility] [address] [place], [place]  [phone]   [facility] [address]  [place], [place] [phone] or [phone]   Substance Abuse Resources Organization         Address  Phone  Notes  [facility]  [phone]   [facility]  [phone]   [facility]  [phone]   Floydene Flock  [phone]   Residential & Outpatient Substance Abuse Program  [phone]   Psychological Services Organization         Address  Phone  Notes  [facility]  [phone]   [facility] Services  [phone]   [facility] Mental Health [address], [place] [phone] or [phone]    Mobile Crisis Teams Organization         Address  Phone  Notes  [facility], Mobile Crisis Care Unit  [phone]    [facility]  [address] [place], [place] [phone]   [facility] [address], [place]  [address] [place] [phone]    Self-Help/Support Groups Organization         Address  Phone             Notes  [facility] [place] - variety of support groups  336- [phone] Call for more information  Narcotics Anonymous (NA), [facility] [address], [place]  2 meetings at this location   Statisticianesidential Treatment Programs Organization         Address  Phone  Notes  [facility] [address],    [place]  [phone]   [facility]  [address], [place] [phone], [place], [place] [phone]   [facility] [address], [place] [phone] Admissions: 8am-3pm M-F  [facility] [address]    [place], [place] [phone]   [facility] [address], [place], [place] [phone]   [facility] [address]  [place], [place] [phone]   [facility] - Intensive Outpatient [address], [place], [place] [phone]   [facility] [address]  [place], [place] [phone] or [phone]   [facility] ([facility]) [address] [place], [place] [phone] Accepts Medicaid  [facility] [address]  [place] [phone] Substance Abuse/Addiction Treatment   [facility] Behavioral Health Resources Organization         Address  Phone  Notes  [facility]  [phone]) [phone]   Angie FavaJulie Brannon, PhD [address] [place], [place]   [phone]) [phone] or [phone](336) [phone]   [facility]   [address] [place], [place] [phone]) [phone]   [facility] [address], [place], [place] [phone]) [phone] Insurance/Medicaid/sponsorship through [facility]  [facility] [address]                                     [place], [place] [phone]) [phone] Therapy/tele-psych/case  [facility] [address]   [place], [place] [phone](336) [phone]    Dr. Lolly MustacheArfeen  [phone]   [facility]  [employer] [facility] 1) [address], [place] 2) [address], [place] 3)  [address], [place] [phone](336) [phone]  [phone]) [phone]   [facility] Child Abuse Hotline [phone](336) [phone] or [phone]) [phone] (After Hours)

## 2014-02-23 NOTE — ED Notes (Signed)
Pt A&O4, ambulatory at d/c with steady gait, NAD 

## 2014-03-17 ENCOUNTER — Emergency Department (HOSPITAL_COMMUNITY)
Admission: EM | Admit: 2014-03-17 | Discharge: 2014-03-17 | Disposition: A | Discharge status: Home / Self Care | Payer: Medicare Other | Attending: Emergency Medicine | Admitting: Emergency Medicine

## 2014-03-17 ENCOUNTER — Encounter (HOSPITAL_COMMUNITY): Payer: Self-pay | Admitting: Emergency Medicine

## 2014-03-17 DIAGNOSIS — F17200 Nicotine dependence, unspecified, uncomplicated: Secondary | ICD-10-CM | POA: Insufficient documentation

## 2014-03-17 DIAGNOSIS — Z880 Allergy status to penicillin: Secondary | ICD-10-CM | POA: Insufficient documentation

## 2014-03-17 DIAGNOSIS — K2970 Gastritis, unspecified, without bleeding: Secondary | ICD-10-CM

## 2014-03-17 DIAGNOSIS — R1084 Generalized abdominal pain: Secondary | ICD-10-CM | POA: Diagnosis not present

## 2014-03-17 DIAGNOSIS — K2990 Gastroduodenitis, unspecified, without bleeding: Secondary | ICD-10-CM | POA: Diagnosis not present

## 2014-03-17 DIAGNOSIS — K2900 Acute gastritis without bleeding: Secondary | ICD-10-CM | POA: Diagnosis not present

## 2014-03-17 LAB — COMPREHENSIVE METABOLIC PANEL
ALT: 12 U/L (ref 0–53)
ANION GAP: 15 (ref 5–15)
AST: 20 U/L (ref 0–37)
Albumin: 3.7 g/dL (ref 3.5–5.2)
Alkaline Phosphatase: 46 U/L (ref 39–117)
BUN: 6 mg/dL (ref 6–23)
CALCIUM: 8.7 mg/dL (ref 8.4–10.5)
CO2: 24 meq/L (ref 19–32)
CREATININE: 1 mg/dL (ref 0.50–1.35)
Chloride: 98 mEq/L (ref 96–112)
GLUCOSE: 81 mg/dL (ref 70–99)
Potassium: 3.8 mEq/L (ref 3.7–5.3)
SODIUM: 137 meq/L (ref 137–147)
Total Bilirubin: 1 mg/dL (ref 0.3–1.2)
Total Protein: 7.4 g/dL (ref 6.0–8.3)

## 2014-03-17 LAB — URINALYSIS, ROUTINE W REFLEX MICROSCOPIC
Bilirubin Urine: NEGATIVE
Glucose, UA: NEGATIVE mg/dL
HGB URINE DIPSTICK: NEGATIVE
Ketones, ur: NEGATIVE mg/dL
Leukocytes, UA: NEGATIVE
NITRITE: NEGATIVE
PROTEIN: NEGATIVE mg/dL
Specific Gravity, Urine: 1.006 (ref 1.005–1.030)
UROBILINOGEN UA: 1 mg/dL (ref 0.0–1.0)
pH: 5.5 (ref 5.0–8.0)

## 2014-03-17 LAB — CBC WITH DIFFERENTIAL/PLATELET
Basophils Absolute: 0 10*3/uL (ref 0.0–0.1)
Basophils Relative: 0 % (ref 0–1)
EOS PCT: 1 % (ref 0–5)
Eosinophils Absolute: 0.1 10*3/uL (ref 0.0–0.7)
HCT: 40 % (ref 39.0–52.0)
Hemoglobin: 13.2 g/dL (ref 13.0–17.0)
LYMPHS ABS: 1.2 10*3/uL (ref 0.7–4.0)
Lymphocytes Relative: 23 % (ref 12–46)
MCH: 29.6 pg (ref 26.0–34.0)
MCHC: 33 g/dL (ref 30.0–36.0)
MCV: 89.7 fL (ref 78.0–100.0)
MONO ABS: 0.8 10*3/uL (ref 0.1–1.0)
Monocytes Relative: 15 % — ABNORMAL HIGH (ref 3–12)
Neutro Abs: 3.3 10*3/uL (ref 1.7–7.7)
Neutrophils Relative %: 61 % (ref 43–77)
Platelets: 264 10*3/uL (ref 150–400)
RBC: 4.46 MIL/uL (ref 4.22–5.81)
RDW: 12.4 % (ref 11.5–15.5)
WBC: 5.3 10*3/uL (ref 4.0–10.5)

## 2014-03-17 LAB — ETHANOL: Alcohol, Ethyl (B): 24 mg/dL — ABNORMAL HIGH (ref 0–11)

## 2014-03-17 LAB — LIPASE, BLOOD: LIPASE: 29 U/L (ref 11–59)

## 2014-03-17 MED ORDER — HYDROCODONE-ACETAMINOPHEN 5-325 MG PO TABS: 1.0000 | ORAL_TABLET | Freq: Four times a day (QID) | ORAL | Status: DC | PRN

## 2014-03-17 MED ORDER — SODIUM CHLORIDE 0.9 % IV BOLUS (SEPSIS)
2000.0000 mL | Freq: Once | INTRAVENOUS | Status: AC
  Administered 2014-03-17: 2000 mL via INTRAVENOUS

## 2014-03-17 MED ORDER — ONDANSETRON 4 MG PO TBDP: ORAL_TABLET | ORAL | Status: DC

## 2014-03-17 MED ORDER — OMEPRAZOLE 20 MG PO CPDR: 20.0000 mg | DELAYED_RELEASE_CAPSULE | Freq: Every day | ORAL | Status: DC

## 2014-03-17 MED ORDER — MORPHINE SULFATE 2 MG/ML IJ SOLN
2.0000 mg | Freq: Once | INTRAMUSCULAR | Status: AC
  Administered 2014-03-17: 2 mg via INTRAVENOUS
  Filled 2014-03-17: qty 1

## 2014-03-17 MED ORDER — ONDANSETRON HCL 4 MG/2ML IJ SOLN
4.0000 mg | Freq: Once | INTRAMUSCULAR | Status: AC
  Administered 2014-03-17: 4 mg via INTRAVENOUS
  Filled 2014-03-17: qty 2

## 2014-03-17 NOTE — ED Provider Notes (Signed)
CSN: [id number]     Arrival date & time 03/17/14  1350 History   First MD Initiated Contact with Patient 03/17/14 1701     Chief Complaint  Patient presents with  . Abdominal Pain  . Emesis     (Consider location/radiation/quality/duration/timing/severity/associated sxs/prior Treatment) The history is provided by the patient and medical records. No language interpreter was used.    Gene Rivera is a 33 y.o. male  with no major medical Hx presents to the Emergency Department complaining of gradual, persistent, progressively worsening generalized and epigastric abd pain onset 3 days ago. Associated symptoms include NBNB emesis with occasional episodes of loose stool but no melena or hematochaezia. Pt reports he is a social drinker, but does consume large quantities of EtOH.  He relates a similar episode of symptoms several weeks ago and pt was dx with pancreatitis at that time.  He also endorses subjective low grade fevers at home, but has not measured them.  Pt denies OTC treatments PTA.  Patient reports that emesis began approximately 24 hours prior to the abdominal pain. Abdominal pain is described as soreness and aching, worse with movement and rated at an 8/10. Nothing makes it better and eating also makes it worse, inducing inducing vomiting.  Pt reports last emesis was last night after eating a cheeseburger and fries.  Pt denies headache and neck pain, chest pain, shortness of breath, diarrhea, weakness, dizziness, syncope, dysuria and hematuria.     History reviewed. No pertinent past medical history. Past Surgical History  Procedure Laterality Date  . Lung surgery    . Lobectomy Left 1990   No family history on file. History  Substance Use Topics  . Smoking status: Current Every Day Smoker -- 0.50 packs/day  . Smokeless tobacco: Not on file  . Alcohol Use: No    Review of Systems  Constitutional: Negative for fever, diaphoresis, appetite change, fatigue and unexpected weight  change.  HENT: Negative for mouth sores and trouble swallowing.   Eyes: Negative for visual disturbance.  Respiratory: Negative for cough, chest tightness, shortness of breath, wheezing and stridor.   Cardiovascular: Negative for chest pain and palpitations.  Gastrointestinal: Positive for nausea, vomiting and abdominal pain. Negative for diarrhea, constipation, blood in stool, abdominal distention and rectal pain.  Endocrine: Negative for polydipsia, polyphagia and polyuria.  Genitourinary: Negative for dysuria, urgency, frequency, hematuria, flank pain and difficulty urinating.  Musculoskeletal: Negative for back pain, neck pain and neck stiffness.  Skin: Negative for rash.  Allergic/Immunologic: Negative for immunocompromised state.  Neurological: Negative for syncope, weakness, light-headedness and headaches.  Hematological: Negative for adenopathy. Does not bruise/bleed easily.  Psychiatric/Behavioral: Negative for confusion and sleep disturbance. The patient is not nervous/anxious.   All other systems reviewed and are negative.     Allergies  Penicillins  Home Medications   Prior to Admission medications   Medication Sig Start Date End Date Taking? Authorizing Provider  HYDROcodone-acetaminophen (NORCO/VICODIN) 5-325 MG per tablet Take 1-2 tablets by mouth every 6 (six) hours as needed for moderate pain or severe pain. 03/17/14   Nuriya Stuck, PA-C  omeprazole (PRILOSEC) 20 MG capsule Take 1 capsule (20 mg total) by mouth daily. 03/17/14   Desira Alessandrini, PA-C  ondansetron (ZOFRAN ODT) 4 MG disintegrating tablet  ODT q4 hours prn nausea/vomit 03/17/14   Shai Mckenzie, PA-C   BP 110/61  Pulse 71  Temp(Src) 99.3 F (37.4 C) (Oral)  Resp 20  SpO2 99% Physical Exam  Nursing note and vitals reviewed.  Constitutional: He appears well-developed and well-nourished. No distress.  Awake, alert, nontoxic appearance  HENT:  Head: Normocephalic and atraumatic.   Mouth/Throat: Oropharynx is clear and moist. No oropharyngeal exudate.  Eyes: Conjunctivae are normal. No scleral icterus.  Neck: Normal range of motion. Neck supple.  Cardiovascular: Normal rate, regular rhythm, normal heart sounds and intact distal pulses.   Pulmonary/Chest: Effort normal and breath sounds normal. No respiratory distress. He has no wheezes.  Equal chest expansion  Abdominal: Soft. Bowel sounds are normal. He exhibits no distension and no mass. There is no hepatosplenomegaly. There is generalized tenderness. There is guarding. There is no rebound and no CVA tenderness. Hernia confirmed negative in the right inguinal area and confirmed negative in the left inguinal area.  Generalized abdominal tenderness worse in the epigastric and periumbilical region with voluntary guarding No rebound or peritoneal signs No CVA tenderness  Genitourinary: Testes normal and penis normal. Cremasteric reflex is present. Right testis shows no mass, no swelling and no tenderness. Right testis is descended. Cremasteric reflex is not absent on the right side. Left testis shows no mass, no swelling and no tenderness. Left testis is descended. Cremasteric reflex is not absent on the left side. No hypospadias, penile erythema or penile tenderness. No discharge found.  Musculoskeletal: Normal range of motion. He exhibits no edema.  Lymphadenopathy:       Right: No inguinal adenopathy present.       Left: No inguinal adenopathy present.  Neurological: He is alert.  Speech is clear and goal oriented Moves extremities without ataxia  Skin: Skin is warm and dry. He is not diaphoretic.  Psychiatric: He has a normal mood and affect.    ED Course  Procedures (including critical care time) Labs Review Labs Reviewed  CBC WITH DIFFERENTIAL - Abnormal; Notable for the following:    Monocytes Relative 15 (*)    All other components within normal limits  ETHANOL - Abnormal; Notable for the following:     Alcohol, Ethyl (B) 24 (*)    All other components within normal limits  COMPREHENSIVE METABOLIC PANEL  URINALYSIS, ROUTINE W REFLEX MICROSCOPIC  LIPASE, BLOOD    Imaging Review No results found.   EKG Interpretation None      MDM   Final diagnoses:  Gastritis  Generalized abdominal pain   Gene Rivera presents with several days of nausea and vomiting with developing a generalized and epigastric abdominal pain and soreness. Patient without blood in his emesis.  Patient has not attempted any treatment at home and has not made any diet modifications to prevent vomiting.  Patient continues to consume alcohol.  Meds reassuring. Patient without leukocytosis, no elevation of lipase or alterations in kidney function.  Patient is afebrile, non-tachycardic here in the emergency department. He tolerated by mouth without difficulty.  The abdomen remains sore to palpation but without rebound or guarding.  Discussed with the patient that this is likely secondary to a gastritis, most likely alcoholic in nature.  I have offered a CT scan for further evaluation and patient declines at this time. He is well appearing and I think this is reasonable.  Instructed to advance diet slowly including bland foods and avoidance of fried, spicy or greasy foods.  I also instructed him to decrease his alcohol usage.  I have personally reviewed patient's vitals, nursing note and any pertinent labs or imaging.  I performed an undressed physical exam.    At this time, it has been determined that no acute  conditions requiring further emergency intervention. The patient/guardian have been advised of the diagnosis and plan. I reviewed all labs and imaging including any potential incidental findings. We have discussed signs and symptoms that warrant return to the ED, such as hematemesis, persistent vomiting, high fevers or localization of his abdominal pain.  Patient/guardian has voiced understanding and agreed to  follow-up with the PCP or specialist in 3 days for further evaluation.  Vital signs are stable at discharge.   BP 110/61  Pulse 71  Temp(Src) 99.3 F (37.4 C) (Oral)  Resp 20  SpO2 99%        Dierdre Forth, PA-C 03/17/14 1901

## 2014-03-17 NOTE — ED Notes (Signed)
PA at bedside.

## 2014-03-17 NOTE — ED Notes (Signed)
Pt is here with diarrhea and mid to lower abdominal pain with vomiting for couple of days

## 2014-03-17 NOTE — ED Provider Notes (Signed)
Medical screening examination/treatment/procedure(s) were performed by non-physician practitioner and as supervising physician I was immediately available for consultation/collaboration.   EKG Interpretation None       Martha K Linker, MD 03/17/14 1902 

## 2014-03-17 NOTE — Discharge Instructions (Signed)
1. Medications: omeprazole, vicodin, zofran, usual home medications 2. Treatment: rest, drink plenty of fluids, advance diet slowly 3. Follow Up: Please followup with your primary doctor for discussion of your diagnoses and further evaluation after today's visit; if you do not have a primary care doctor use the resource guide provided to find one;    Gastritis, Adult Gastritis is soreness and puffiness (inflammation) of the lining of the stomach. If you do not get help, gastritis can cause bleeding and sores (ulcers) in the stomach. HOME CARE   Only take medicine as told by your doctor.  If you were given antibiotic medicines, take them as told. Finish the medicines even if you start to feel better.  Drink enough fluids to keep your pee (urine) clear or pale yellow.  Avoid foods and drinks that make your problems worse. Foods you may want to avoid include:  Caffeine or alcohol.  Chocolate.  Mint.  Garlic and onions.  Spicy foods.  Citrus fruits, including oranges, lemons, or limes.  Food containing tomatoes, including sauce, chili, salsa, and pizza.  Fried and fatty foods.  Eat small meals throughout the day instead of large meals. GET HELP RIGHT AWAY IF:   You have black or dark red poop (stools).  You throw up (vomit) blood. It may look like coffee grounds.  You cannot keep fluids down.  Your belly (abdominal) pain gets worse.  You have a fever.  You do not feel better after 1 week.  You have any other questions or concerns. MAKE SURE YOU:   Understand these instructions.  Will watch your condition.  Will get help right away if you are not doing well or get worse. Document Released: 12/17/2007 Document Revised: 09/22/2011 Document Reviewed: 08/13/2011 [facility] Patient Information 2015 [place], [place]. This information is not intended to replace advice given to you by your health care provider. Make sure you discuss any questions you have with your health  care provider.    Emergency Department Resource Guide 1) Find a Doctor and Pay Out of Pocket Although you won't have to find out who is covered by your insurance plan, it is a good idea to ask around and get recommendations. You will then need to call the office and see if the doctor you have chosen will accept you as a new patient and what types of options they offer for patients who are self-pay. Some doctors offer discounts or will set up payment plans for their patients who do not have insurance, but you will need to ask so you aren't surprised when you get to your appointment.  2) Contact Your Local Health Department Not all health departments have doctors that can see patients for sick visits, but many do, so it is worth a call to see if yours does. If you don't know where your local health department is, you can check in your phone book. The CDC also has a tool to help you locate your state's health department, and many state websites also have listings of all of their local health departments.  3) Find a Walk-in Clinic If your illness is not likely to be very severe or complicated, you may want to try a walk in clinic. These are popping up all over the country in pharmacies, drugstores, and shopping centers. They're usually staffed by nurse practitioners or physician assistants that have been trained to treat common illnesses and complaints. They're usually fairly quick and inexpensive. However, if you have serious medical issues or chronic medical  problems, these are probably not your best option.  No Primary Care Doctor: - Call Health Connect at  [phone] - they can help you locate a primary care doctor that  accepts your insurance, provides certain services, etc. - Physician Referral Service- [phone]  Chronic Pain Problems: Organization         Address  Phone   Notes  [facility]  [phone] Patients need to be referred by their primary care doctor.    Medication Assistance: Organization         Address  Phone   Notes  [facility] Medication [facility] [address] [place], [place] [address] [phone] --Must be a resident of [facility] -- Must have NO insurance coverage whatsoever (no Medicaid/ Medicare, etc.) -- The pt. MUST have a primary care doctor that directs their care regularly and follows them in the community   [facility]ist  [phone]   [employer]  [phone]    Agencies that provide inexpensive medical care: Organization         Address  Phone   Notes  [place]  [phone]   [facility]    [phone]   [facility] [place], [place] [address] [phone]   [place] [address] [place] [phone]   Planned Parenthood    [phone]   [facility]    [phone]   [place] and [address], [place] Phone:  [phone], Fax:  [phone] Hours of Operation:  9 am - 6 pm, M-F.  Also accepts Medicaid/Medicare and self-pay.  [facility] for [place] [address], [address], [place] Phone: [phone], Fax: [phone]. Hours of Operation:  8:30 am - 5:30 pm, M-F.  Also accepts Medicaid and self-pay.  [facility] [place] [address], [place] Phone: [phone]   [place], [place], [place] [phone], Ext. [phone] Mondays & Thursdays: 7-9 AM.  First 15 patients are seen on a first come, first serve basis.    [place] Providers:  Organization         Address  Phone   Notes  [facility] [address], [place] [phone] Also accepts self-pay patients.  [facility] [address], [place]  [phone]   [place], [address], [place] [phone]   [facility] [address], [place] [phone]   Lucianne Lei [address] [place] [address], [place]   [phone] Only accepts [place] Access [place] patients after they have their name applied to their card.   Self-Pay (no insurance) in [facility]:  Organization         Address  Phone   Notes  Sickle Cell Patients, [facility] Internal Medicine [place] [phone]   [facility]gent Care [place] [phone]   [facility] [place]  [place], [place], [place] [phone]   [facility]/Dr. Osei-Bonsu  [address] [place] or [address], [place] [phone] Phone number for both [place] and [place] locations is the same.  Urgent Medical  and [facility] [address] [place] [phone]   [facility] [address] [place] or [address] [phone]   [facility] [address], [place] [phone], phone; [phone], fax Sees patients 1st and 3rd Saturday of every month.  Must not qualify for public or private insurance (i.e. Medicaid, Medicare, [place] Health Choice, Veterans' Benefits)  Household income should be no more than 200% of the poverty level The clinic cannot treat you if you are pregnant or think you are pregnant  Sexually transmitted diseases are not treated at the clinic.    Dental Care: Organization         Address  Phone  Notes  [facility] Department of [facility] [phone] Accepts children up to age 2 who are enrolled in [place] or [place]; pregnant women with a Medicaid card; and children who have applied for Medicaid or [place] Health Choice, but were declined, whose parents can pay a reduced fee at time of service.    [facility] Department of [facility]  [address], [place] [phone] Accepts children up to age 52 who are enrolled in [place] or [place]; pregnant women with a Medicaid card; and children who have applied for Medicaid or [place] Health Choice, but were declined, whose parents can pay a reduced fee at time of service.  [place] Adult Dental Access PROGRAM  [place] [phone] Patients are seen by appointment only. Walk-ins are not accepted. [place] will see patients 52 years of age and older. Monday - Tuesday (8am-5pm) Most Wednesdays (8:30-5pm) $30 per visit, cash only  [facility] Adult Dental Access PROGRAM  [address], [place] [facility] [phone] Patients are seen by appointment only. Walk-ins are not accepted. [place] will see patients 5 years of age and older. One Wednesday Evening (Monthly: Volunteer Based).  $30 per visit, cash only  [place]  [phone] for adults; Children under age 89, call Graduate Pediatric Dentistry at [phone]. Children aged 75-14, please call [phone] to request a pediatric application.  Dental services are provided in all areas of dental care including fillings, crowns and bridges, complete and partial dentures, implants, gum treatment, root canals, and extractions. Preventive care is also provided. Treatment is provided to both adults and children. Patients are selected via a lottery and there is often a waiting list.   [facility] [address], [place]  [phone] [email or web address]   [facility] [address] [place], [place] [phone], Ext. [phone] Second and Fourth Thursday of each month, opens at 6:30 AM; Clinic ends at 9 AM.  Patients are seen on a first-come first-served basis, and a limited number are seen during each clinic.   [facility]  [address] Hillard Danker [place], [place] [phone]   Eligibility Requirements You must have lived in [place], [place], or [place] counties for at least the last three months.   You cannot be eligible for state or federal sponsored [employer], including [employer], [place], or [employer].   You generally cannot be eligible for healthcare insurance through your employer.    How to apply: Eligibility screenings are held every Tuesday and Wednesday afternoon from 1:00 pm until 4:00 pm. You do not need an appointment for the interview!  [facility] [address], [place], [place]   [place]  [place] Department  [place]  [phone]    Behavioral Health Resources in the Community: Intensive Outpatient Programs Organization         Address  Phone  Notes  [place]. [address] [place], [place] [phone]   [facility] Outpatient [address], [place], [place]   ADS: Alcohol & Drug Svcs [address], [place], [place]   [place] [address],  [place], [place] or [phone]   Substance Abuse Resources Organization         Address  Phone  Notes  Alcohol and Drug Services  [phone]   [place]  [phone]   [facility]   Chinita Pester  [phone]   Residential & Outpatient Substance Abuse Program  [phone]   Psychological Services Organization         Address  Phone  Notes  [facility] [place]  [place]  [phone]   [place] [address] [place] or [phone]    Mobile Crisis Teams Organization         Address  Phone  Notes  [facility], Mobile Crisis Care Unit  [phone]   [facility]  [address] [place], [place]   [place] [address], [place] [phone]    Self-Help/Support Groups Organization         Address  Phone             Notes  [place]. of [place] - variety of support groups  [place] Call for more information  Narcotics Anonymous (NA), [facility] [address], [employer] [place]  2 meetings at this location   Special educational needs teacher         Address  Phone  Notes  ASAP Residential Treatment [place],    [place]  [phone]   [facility]  [address] [place] [id number], [place], [place]   [place], [place] [phone] Admissions: 8am-3pm M-F  Incentives Substance [place] [address],    [place], [place] [phone]   [facility] [address] [place], [place], [place]   [facility] [address],  [place], [place]   [facility]ight Programs - Intensive Outpatient [address], [place], [place]   [facility] ([place].,  [place], [place] [phone] or [phone]   [facility] Services (RTS) [address] [place], [place] Accepts Medicaid  [facility] [place] [address],  [place] [phone] Substance Abuse/Addiction Treatment   [facility] Organization         Address  Phone  Notes  [facility]  [phone]   Domenic Schwab, PhD [address], [place]   [phone] or [phone]   [place], [place] [phone]   [facility] [address] [place], [place] [phone] Insurance/Medicaid/sponsorship through [employer] and Families [address]  [place], [place] [phone]  [place], [place] [phone]    Dr. Adele Schilder  [phone]   [facility] Dept. 1) [address], [place] 2) [place] 3)  [place] [address], [place] [phone]  [phone]   [place] [phone] or [phone] (After Hours)

## 2014-03-17 NOTE — ED Notes (Signed)
Pt A&OX4, ambulatory at d/c with steady gait,, NAD

## 2014-04-05 ENCOUNTER — Emergency Department (HOSPITAL_COMMUNITY)
Admission: EM | Admit: 2014-04-05 | Discharge: 2014-04-05 | Disposition: A | Discharge status: Home / Self Care | Payer: Medicare Other | Attending: Emergency Medicine | Admitting: Emergency Medicine

## 2014-04-05 ENCOUNTER — Encounter (HOSPITAL_COMMUNITY): Payer: Self-pay | Admitting: Emergency Medicine

## 2014-04-05 ENCOUNTER — Emergency Department (HOSPITAL_COMMUNITY): Payer: Medicare Other

## 2014-04-05 DIAGNOSIS — R0789 Other chest pain: Secondary | ICD-10-CM

## 2014-04-05 DIAGNOSIS — R05 Cough: Secondary | ICD-10-CM | POA: Insufficient documentation

## 2014-04-05 DIAGNOSIS — F17200 Nicotine dependence, unspecified, uncomplicated: Secondary | ICD-10-CM | POA: Diagnosis not present

## 2014-04-05 DIAGNOSIS — F1210 Cannabis abuse, uncomplicated: Secondary | ICD-10-CM | POA: Diagnosis not present

## 2014-04-05 DIAGNOSIS — R1013 Epigastric pain: Secondary | ICD-10-CM | POA: Diagnosis not present

## 2014-04-05 DIAGNOSIS — Z79899 Other long term (current) drug therapy: Secondary | ICD-10-CM | POA: Diagnosis not present

## 2014-04-05 DIAGNOSIS — J45901 Unspecified asthma with (acute) exacerbation: Secondary | ICD-10-CM

## 2014-04-05 DIAGNOSIS — J45909 Unspecified asthma, uncomplicated: Secondary | ICD-10-CM | POA: Diagnosis not present

## 2014-04-05 DIAGNOSIS — R059 Cough, unspecified: Secondary | ICD-10-CM | POA: Diagnosis not present

## 2014-04-05 DIAGNOSIS — F1110 Opioid abuse, uncomplicated: Secondary | ICD-10-CM | POA: Insufficient documentation

## 2014-04-05 DIAGNOSIS — R071 Chest pain on breathing: Secondary | ICD-10-CM | POA: Diagnosis not present

## 2014-04-05 DIAGNOSIS — Z880 Allergy status to penicillin: Secondary | ICD-10-CM | POA: Insufficient documentation

## 2014-04-05 LAB — CBC WITH DIFFERENTIAL/PLATELET
BASOS ABS: 0 10*3/uL (ref 0.0–0.1)
BASOS PCT: 0 % (ref 0–1)
Eosinophils Absolute: 0.1 10*3/uL (ref 0.0–0.7)
Eosinophils Relative: 2 % (ref 0–5)
HEMATOCRIT: 39.4 % (ref 39.0–52.0)
Hemoglobin: 13.1 g/dL (ref 13.0–17.0)
Lymphocytes Relative: 33 % (ref 12–46)
Lymphs Abs: 2.2 10*3/uL (ref 0.7–4.0)
MCH: 30 pg (ref 26.0–34.0)
MCHC: 33.2 g/dL (ref 30.0–36.0)
MCV: 90.2 fL (ref 78.0–100.0)
Monocytes Absolute: 0.6 10*3/uL (ref 0.1–1.0)
Monocytes Relative: 9 % (ref 3–12)
Neutro Abs: 3.8 10*3/uL (ref 1.7–7.7)
Neutrophils Relative %: 56 % (ref 43–77)
Platelets: 250 10*3/uL (ref 150–400)
RBC: 4.37 MIL/uL (ref 4.22–5.81)
RDW: 12.7 % (ref 11.5–15.5)
WBC: 6.8 10*3/uL (ref 4.0–10.5)

## 2014-04-05 LAB — COMPREHENSIVE METABOLIC PANEL
ALT: 11 U/L (ref 0–53)
AST: 17 U/L (ref 0–37)
Albumin: 3.6 g/dL (ref 3.5–5.2)
Alkaline Phosphatase: 48 U/L (ref 39–117)
Anion gap: 10 (ref 5–15)
BUN: 9 mg/dL (ref 6–23)
CALCIUM: 8.8 mg/dL (ref 8.4–10.5)
CO2: 26 mEq/L (ref 19–32)
Chloride: 104 mEq/L (ref 96–112)
Creatinine, Ser: 0.99 mg/dL (ref 0.50–1.35)
GFR calc Af Amer: >90 mL/min (ref >90)
GFR calc non Af Amer: >90 mL/min (ref >90)
Glucose, Bld: 82 mg/dL (ref 70–99)
Potassium: 4.1 mEq/L (ref 3.7–5.3)
Sodium: 140 mEq/L (ref 137–147)
Total Bilirubin: 0.5 mg/dL (ref 0.3–1.2)
Total Protein: 6.8 g/dL (ref 6.0–8.3)

## 2014-04-05 LAB — RAPID URINE DRUG SCREEN, HOSP PERFORMED
Amphetamines: NOT DETECTED
BENZODIAZEPINES: NOT DETECTED
Barbiturates: NOT DETECTED
Cocaine: NOT DETECTED
Opiates: POSITIVE — AB
Tetrahydrocannabinol: POSITIVE — AB

## 2014-04-05 LAB — LIPASE, BLOOD: LIPASE: 59 U/L (ref 11–59)

## 2014-04-05 LAB — D-DIMER, QUANTITATIVE (NOT AT ARMC)

## 2014-04-05 MED ORDER — MORPHINE SULFATE 4 MG/ML IJ SOLN
4.0000 mg | Freq: Once | INTRAMUSCULAR | Status: AC
  Administered 2014-04-05: 4 mg via INTRAVENOUS
  Filled 2014-04-05: qty 1

## 2014-04-05 MED ORDER — ALBUTEROL SULFATE HFA 108 (90 BASE) MCG/ACT IN AERS
2.0000 | INHALATION_SPRAY | Freq: Once | RESPIRATORY_TRACT | Status: AC
  Administered 2014-04-05: 2 via RESPIRATORY_TRACT
  Filled 2014-04-05: qty 6.7

## 2014-04-05 MED ORDER — IBUPROFEN 800 MG PO TABS: 800.0000 mg | ORAL_TABLET | Freq: Three times a day (TID) | ORAL | Status: DC | PRN

## 2014-04-05 MED ORDER — IPRATROPIUM BROMIDE 0.02 % IN SOLN
0.5000 mg | Freq: Once | RESPIRATORY_TRACT | Status: AC
Start: 2014-04-05 — End: 2014-04-05
  Administered 2014-04-05: 0.5 mg via RESPIRATORY_TRACT
  Filled 2014-04-05: qty 2.5

## 2014-04-05 MED ORDER — ALBUTEROL SULFATE (2.5 MG/3ML) 0.083% IN NEBU
5.0000 mg | INHALATION_SOLUTION | Freq: Once | RESPIRATORY_TRACT | Status: AC
  Administered 2014-04-05: 5 mg via RESPIRATORY_TRACT
  Filled 2014-04-05: qty 6

## 2014-04-05 MED ORDER — ALBUTEROL SULFATE HFA 108 (90 BASE) MCG/ACT IN AERS: 1.0000 | INHALATION_SPRAY | Freq: Four times a day (QID) | RESPIRATORY_TRACT | Status: DC | PRN

## 2014-04-05 NOTE — ED Notes (Signed)
Pt thinks his asthma is acting up has a cough x 4 days  Hurts to breath

## 2014-04-05 NOTE — Discharge Instructions (Signed)
Read the information below.  Use the prescribed medication as directed.  Please discuss all new medications with your pharmacist.  You may return to the Emergency Department at any time for worsening condition or any new symptoms that concern you.  If you develop worsening chest pain, shortness of breath, fever, you pass out, or become weak or dizzy, return to the ER for a recheck.     You have been diagnosed by your caregiver as having chest wall pain. SEEK IMMEDIATE MEDICAL ATTENTION IF: You develop a fever.  Your chest pains become severe or intolerable.  You develop new, unexplained symptoms (problems).  You develop shortness of breath, nausea, vomiting, sweating or feel light headed.  You develop a new cough or you cough up blood.   Asthma Asthma is a condition of the lungs in which the airways tighten and narrow. Asthma can make it hard to breathe. Asthma cannot be cured, but medicine and lifestyle changes can help control it. Asthma may be started (triggered) by:  Animal skin flakes (dander).  Dust.  Cockroaches.  Pollen.  Mold.  Smoke.  Cleaning products.  Hair sprays or aerosol sprays.  Paint fumes or strong smells.  Cold air, weather changes, and winds.  Crying or laughing hard.  Stress.  Certain medicines or drugs.  Foods, such as dried fruit, potato chips, and sparkling grape juice.  Infections or conditions (colds, flu).  Exercise.  Certain medical conditions or diseases.  Exercise or tiring activities. HOME CARE   Take medicine as told by your doctor.  Use a peak flow meter as told by your doctor. A peak flow meter is a tool that measures how well the lungs are working.  Record and keep track of the peak flow meter's readings.  Understand and use the asthma action plan. An asthma action plan is a written plan for taking care of your asthma and treating your attacks.  To help prevent asthma attacks:  Do not smoke. Stay away from secondhand  smoke.  Change your heating and air conditioning filter often.  Limit your use of fireplaces and wood stoves.  Get rid of pests (such as roaches and mice) and their droppings.  Throw away plants if you see mold on them.  Clean your floors. Dust regularly. Use cleaning products that do not smell.  Have someone vacuum when you are not home. Use a vacuum cleaner with a HEPA filter if possible.  Replace carpet with wood, tile, or vinyl flooring. Carpet can trap animal skin flakes and dust.  Use allergy-proof pillows, mattress covers, and box spring covers.  Wash bed sheets and blankets every week in hot water and dry them in a dryer.  Use blankets that are made of polyester or cotton.  Clean bathrooms and kitchens with bleach. If possible, have someone repaint the walls in these rooms with mold-resistant paint. Keep out of the rooms that are being cleaned and painted.  Wash hands often. GET HELP IF:  You have make a whistling sound when breaking (wheeze), have shortness of breath, or have a cough even if taking medicine to prevent attacks.  The colored mucus you cough up (sputum) is thicker than usual.  The colored mucus you cough up changes from clear or white to yellow, green, gray, or bloody.  You have problems from the medicine you are taking such as:  A rash.  Itching.  Swelling.  Trouble breathing.  You need reliever medicines more than 2-3 times a week.  Your peak  flow measurement is still at 50-79% of your personal best after following the action plan for 1 hour.  You have a fever. GET HELP RIGHT AWAY IF:   You seem to be worse and are not responding to medicine during an asthma attack.  You are short of breath even at rest.  You get short of breath when doing very little activity.  You have trouble eating, drinking, or talking.  You have chest pain.  You have a fast heartbeat.  Your lips or fingernails start to turn blue.  You are light-headed,  dizzy, or faint.  Your peak flow is less than 50% of your personal best. MAKE SURE YOU:   Understand these instructions.  Will watch your condition.  Will get help right away if you are not doing well or get worse. Document Released: 12/17/2007 Document Revised: 11/14/2013 Document Reviewed: 01/27/2013 [facility] Patient Information 2015 [place], [place]. This information is not intended to replace advice given to you by your health care provider. Make sure you discuss any questions you have with your health care provider.  Chest Wall Pain Chest wall pain is pain felt in or around the chest bones and muscles. It may take up to 6 weeks to get better. It may take longer if you are active. Chest wall pain can happen on its own. Other times, things like germs, injury, coughing, or exercise can cause the pain. HOME CARE   Avoid activities that make you tired or cause pain. Try not to use your chest, belly (abdominal), or side muscles. Do not use heavy weights.  Put ice on the sore area.  Put ice in a plastic bag.  Place a towel between your skin and the bag.  Leave the ice on for 15-20 minutes for the first 2 days.  Only take medicine as told by your doctor. GET HELP RIGHT AWAY IF:   You have more pain or are very uncomfortable.  You have a fever.  Your chest pain gets worse.  You have new problems.  You feel sick to your stomach (nauseous) or throw up (vomit).  You start to sweat or feel lightheaded.  You have a cough with mucus (phlegm).  You cough up blood. MAKE SURE YOU:   Understand these instructions.  Will watch your condition.  Will get help right away if you are not doing well or get worse. Document Released: 12/17/2007 Document Revised: 09/22/2011 Document Reviewed: 02/24/2011 [facility] Patient Information 2015 [place], [place]. This information is not intended to replace advice given to you by your health care provider. Make sure you discuss any questions you  have with your health care provider.    Emergency Department Resource Guide 1) Find a Doctor and Pay Out of Pocket Although you won't have to find out who is covered by your insurance plan, it is a good idea to ask around and get recommendations. You will then need to call the office and see if the doctor you have chosen will accept you as a new patient and what types of options they offer for patients who are self-pay. Some doctors offer discounts or will set up payment plans for their patients who do not have insurance, but you will need to ask so you aren't surprised when you get to your appointment.  2) Contact Your Local Health Department Not all health departments have doctors that can see patients for sick visits, but many do, so it is worth a call to see if yours does. If you  don't know where your local health department is, you can check in your phone book. The CDC also has a tool to help you locate your state's health department, and many state websites also have listings of all of their local health departments.  3) Find a Walk-in Clinic If your illness is not likely to be very severe or complicated, you may want to try a walk in clinic. These are popping up all over the country in pharmacies, drugstores, and shopping centers. They're usually staffed by nurse practitioners or physician assistants that have been trained to treat common illnesses and complaints. They're usually fairly quick and inexpensive. However, if you have serious medical issues or chronic medical problems, these are probably not your best option.  No Primary Care Doctor: - Call Health Connect at  [phone] - they can help you locate a primary care doctor that  accepts your insurance, provides certain services, etc. - Physician Referral Service- [phone]  Chronic Pain Problems: Organization         Address  Phone   Notes  [facility]  [phone] Patients need to be referred by their  primary care doctor.   Medication Assistance: Organization         Address  Phone   Notes  [facility] Medication [facility] [address] [place], [place] [address] [phone] --Must be a resident of [facility] -- Must have NO insurance coverage whatsoever (no Medicaid/ Medicare, etc.) -- The pt. MUST have a primary care doctor that directs their care regularly and follows them in the community   [facility]ist  [phone]   [employer]  [phone]    Agencies that provide inexpensive medical care: Organization         Address  Phone   Notes  [facility]  [phone]   [facility]    [phone]   [facility] [address] [place], [place] [address] [phone]   [facility] [address] [place], [place] [phone]   Planned Parenthood    [phone]   [facility]    [phone]   [facility] [place]  [address], [place] Phone:  [phone], Fax:  [phone] Hours of Operation:  9 am - 6 pm, M-F.  Also accepts Medicaid/Medicare and self-pay.  [facility]  [address], [place] Phone: [phone], Fax: [phone]. Hours of Operation:  8:30 am - 5:30 pm, M-F.  Also accepts Medicaid and self-pay.  [facility] [place] [address], [place] Phone: [phone]   [facility] [address] Natasha Bence [place], [place] [phone], Ext. [phone] Mondays & Thursdays: 7-9 AM.  First 15 patients are seen on a first come, first serve basis.    Medicaid-accepting [facility] Providers:  Organization         Address  Phone   Notes  [facility] [address],  [phone] Also accepts self-pay patients.  [facility] [address] Laurell Josephs [place], [place]  [phone]   [facility] [address], [place] [phone]   [facility] [address], [place] [phone]   Renaye Rakers [address], [place]   [phone] Only accepts [place]  Access Medicaid patients after they have their name applied to their card.   Self-Pay (no insurance) in [facility]:  Organization         Address  Phone   Notes  Sickle Cell Patients, [facility] [address] [place], [place] [phone]   [facility] [address] [place], [place] [phone]   [facility] [place]  [address], [place] [phone]   [facility]/Dr. Osei-Bonsu  [address] [place] or [address], [place] [phone] Phone number for both [place] and [place] locations is the same.  [facility] [address] [place] [phone]   [facility] [address] [place] or [address] [phone]   [facility] [address] [place] [address], [place] [phone], phone; [phone], fax Sees patients 1st and 3rd Saturday of every month.  Must not qualify for public or private insurance (i.e. Medicaid, Medicare, [place] Health Choice, Veterans' Benefits)  Household income should be no more than 200% of the poverty level The clinic cannot treat you if you are pregnant or think you are pregnant  Sexually transmitted diseases are not treated at the clinic.    Dental Care: Organization         Address  Phone  Notes  [facility] Department of [facility] [address] [place], [place] [phone] Accepts children up to age 38 who are enrolled in [place] or [place] Health Choice; pregnant women with a Medicaid card; and children who have applied for Medicaid or [place] Health Choice, but were declined, whose parents can pay a reduced fee  at time of service.  [facility] Department of [facility]  [address], [place] [phone] Accepts children up to age 58 who are enrolled in [place] or [place] Health Choice; pregnant women with a Medicaid card; and children who have applied for Medicaid or [place] Health Choice, but were declined, whose parents can pay a reduced fee at time of service.  [facility]  [address] [place], [place] [phone] Patients are seen by appointment only. Walk-ins are not accepted. [facility] will see patients 80 years of age and older. Monday - Tuesday (8am-5pm) Most Wednesdays (8:30-5pm) $30 per visit, cash only  [facility]  [address], [facility] [phone] Patients are seen by appointment only. Walk-ins are not accepted. [facility] will see patients 66 years of age and older. One Wednesday Evening (Monthly: Volunteer Based).  $30 per visit, cash only  [employer]  [phone] for adults; Children under age 49, call Graduate Pediatric Dentistry at [phone]. Children aged 90-14, please call [phone] to request a pediatric application.  Dental services are provided in all areas of dental care including fillings, crowns and bridges, complete and partial dentures, implants, gum treatment, root canals, and extractions. Preventive care is also provided. Treatment is provided to both adults and children. Patients are selected via a lottery and there is often a waiting list.   [facility] [address], [place]  [phone] [email or web address]   [facility] [address] [place], [place] [phone], Ext. [phone] Second and Fourth Thursday of each month, opens at 6:30 AM; Clinic ends at 9 AM.  Patients are seen on a first-come first-served basis, and a limited number are seen during each clinic.   [facility]  [address] Ether Griffins [place], [place] [phone]   Eligibility Requirements You must have lived in [place], [place], or [place] counties for at least the last three months.   You cannot be eligible for state or federal sponsored [place], including [employer], [place], or [employer].   You generally cannot be eligible for healthcare insurance through your employer.    How to apply: Eligibility screenings are held every Tuesday and Wednesday afternoon from 1:00 pm until 4:00 pm. You do not need an appointment for the interview!  [facility] [address] [place], [place] [phone]   [facility]  [phone]   [facility] Health Department  [phone]   [facility] Health Department  [phone]    Behavioral Health Resources in the Community: Intensive Outpatient Programs Organization         Address  Phone  Notes  [facility] [address] [place], [place] [phone]   [facility] [address] [place], [place] [phone]   [facility] [address] [place], [place]  [phone]   [facility] Mental Health [address],  [place], [place] [phone] or [phone]   Substance Abuse Resources Organization         Address  Phone  Notes  [facility] Drug Services  [phone]   [facility]  [phone]   [facility] [place]  [phone]   Floydene Flock  [phone]   Residential & Outpatient Substance Abuse Program  [phone]   Psychological Services Organization         Address  Phone  Notes  [facility] Behavioral Health  [phone]   [facility]  [phone]   [facility] Mental Health [address] [place] [phone] or [phone]    Mobile Crisis Teams Organization         Address  Phone  Notes  [facility], Mobile Crisis Care Unit  [phone]   Assertive Psychotherapeutic  Services  [address] [place], [place] [phone]   Doristine Locks [address] [place] [phone]    Self-Help/Support Groups Organization         Address  Phone             Notes  [facility] [place] - variety of support groups  336- I7437963 Call for more information  Narcotics Anonymous (NA), [facility] [address], [employer] [place]  2 meetings at this location   Statistician         Address  Phone  Notes  [facility] [address],    [place]  [phone]   [facility]  [address] [place] [address], [place], [place] [phone]   [facility] [address] [place], [place] [phone] Admissions: 8am-3pm M-F  [facility] [address]    [place], [place] [phone]   [facility] [address] Starling Manns [place], [place] [phone]   [facility] [address]  [place], [place] [phone]   [facility] - Intensive Outpatient [address] Laurell Josephs [address], [place], [place] [phone]   [facility] ([facility]iction [facility] [address] [place]  [place], [place] [phone] or [phone]   [facility] (RTS) [address], [place], [place] [phone] Accepts Medicaid  [facility] [address]  [place] [phone] Substance Abuse/Addiction Treatment   [facility] Organization         Address  Phone  Notes  [facility]  9207130517   Angie Fava, PhD [address] Ervin Knack [place], [place]   [phone] or [phone]   [facility] Behavioral   [address] [place], [place] [phone]   [facility] [address], [place], [place] [phone] Insurance/Medicaid/sponsorship through [facility] and Families [address]                                    [place], [place] [phone] Therapy/tele-psych/case  [facility] [address], [place] [phone]    Dr. Lolly Mustache  [phone]   [facility] [place]  [employer] [facility] Dept. 1) [address] [place] 2) [address], [place] 3)  [address], [place] [phone]  [phone]   [facility] Child Abuse Hotline 203-012-2481 or [phone] (After Hours)

## 2014-04-05 NOTE — ED Provider Notes (Signed)
CSN: [id number]     Arrival date & time 04/05/14  4098 History   First MD Initiated Contact with Patient 04/05/14 1534     Chief Complaint  Patient presents with  . Cough     (Consider location/radiation/quality/duration/timing/severity/associated sxs/prior Treatment) The history is provided by the patient.    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  Pain in his chest has been intermittent all week, became intense last night.  Associated SOB.  The pain is sharp, severe, moves from left to right side and back, lasts 1-2 hours at a time, worse with movement and inspiration, better with lying flat. Also notes sore throat and headache.  Has been out of his albuterol x 2 months. Has chronic cough with yellow/clear sputum that is unchanged.   Smokes cigarettes and marijuana.  Denies cocaine use.  Gene Rivera's father has hx DVT, unsure why.  Denies recent immobilization. Gene Rivera works 50-60 hours a week and does heavy lifting regularly.  Denies recent injury or chest trauma.  Denies family hx early CAD, stroke, or sudden death.   History reviewed. No pertinent past medical history. Past Surgical History  Procedure Laterality Date  . Lung surgery    . Lobectomy Left 1990   No family history on file. History  Substance Use Topics  . Smoking status: Current Every Day Smoker -- 0.50 packs/day  . Smokeless tobacco: Not on file  . Alcohol Use: No    Review of Systems  All other systems reviewed and are negative.     Allergies  Penicillins  Home Medications   Prior to Admission medications   Medication Sig Start Date End Date Taking? Authorizing Provider  albuterol (PROVENTIL HFA;VENTOLIN HFA) 108 (90 BASE) MCG/ACT inhaler Inhale 1-2 puffs into the lungs every 6 (six) hours as needed for wheezing or shortness of breath.   Yes Historical Provider, MD  omeprazole (PRILOSEC) 20  MG capsule Take 1 capsule (20 mg total) by mouth daily. 03/17/14  Yes Hannah Muthersbaugh, PA-C   BP 127/82  Pulse 85  Temp(Src) 98.7 F (37.1 C) (Oral)  Resp 12  Ht  (1.753 m)  Wt 150 lb (68.04 kg)  BMI 22.14 kg/m2  SpO2 100% Physical Exam  Nursing note and vitals reviewed. Constitutional: He appears well-developed and well-nourished. No distress.  HENT:  Head: Normocephalic and atraumatic.  Neck: Neck supple.  Cardiovascular: Normal rate and regular rhythm.   Pulmonary/Chest: Effort normal and breath sounds normal. No respiratory distress. He has no wheezes. He has no rales. He exhibits tenderness.  Chest palpation reproduces pain  Abdominal: Soft. He exhibits no distension and no mass. There is tenderness in the epigastric area. There is no rebound and no guarding.  Neurological: He is alert. He exhibits normal muscle tone.  Skin: He is not diaphoretic.    ED Course  Procedures (including critical care time) Labs Review Labs Reviewed  URINE RAPID DRUG SCREEN (HOSP PERFORMED) - Abnormal; Notable for the following:    Opiates POSITIVE (*)    Tetrahydrocannabinol POSITIVE (*)    All other components within normal limits  CBC WITH DIFFERENTIAL  COMPREHENSIVE METABOLIC PANEL  LIPASE, BLOOD  D-DIMER, QUANTITATIVE    Imaging Review Dg Chest 2 View  04/05/2014   CLINICAL DATA:  Cough for 1 week duration  EXAM: CHEST  2 VIEW  COMPARISON:  April 18, 2010  FINDINGS: There  is again noted soft tissue prominence anterior to the trachea on the lateral view which is concerning for partial left upper lobe collapse. This appearance is essentially stable compared to the prior study. A linear focus along the medial aspect of the left lung is also stable which may be indicative of some partial collapse of the left upper lobe ; the finding is more apparent on the lateral view.  Elsewhere lungs are clear. Heart size and pulmonary vascularity are normal. No adenopathy. No bone lesions.   IMPRESSION: No appreciable change compared to prior study from 2011. Findings concerning for a degree of volume loss in the left upper lobe. Given the persistence of this finding, correlation with noncontrast chest CT is advised to further evaluate. No new opacity appreciable.   Electronically Signed   By: Bretta Bang M.D.   On: 04/05/2014 10:55     EKG Interpretation   Date/Time:  Wednesday April 05 2014 16:14:24 EDT Ventricular Rate:  46 PR Interval:  191 QRS Duration: 77 QT Interval:  424 QTC Calculation: 371 R Axis:   84 Text Interpretation:  Ectopic atrial bradycardia Left atrial enlargement  ST elevation likely J point No significant change since last tracing  Confirmed by YAO  MD, DAVID ([id number]) on 04/05/2014 4:19:47 PM      6:48 PM Patient reports great improvement after neb treatment.  Reports relief of chest tightness with treatment.  Lungs CTAB.    MDM   Final diagnoses:  Asthma attack  Chest wall pain    Afebrile, nontoxic patient with hx asthma out of his medications p/w shortness of breath and chest pain.  Chest pain reproducible with palpation. CXR unchanged, LUL changes likely from remote surgery.  CBC, CMP, lipase, d-dimer negative.  Urine drug shows no cocaine. EKG nonischemic.   D/C home with albuterol, ibuprofen.  Discussed result, findings, treatment, and follow up  with patient.  Gene Rivera given return precautions.  Gene Rivera verbalizes understanding and agrees with plan.        [place], PA-C 04/06/14 [phone]

## 2014-04-06 NOTE — ED Provider Notes (Signed)
Medical screening examination/treatment/procedure(s) were performed by non-physician practitioner and as supervising physician I was immediately available for consultation/collaboration.   EKG Interpretation   Date/Time:  Wednesday April 05 2014 16:14:24 EDT Ventricular Rate:  46 PR Interval:  191 QRS Duration: 77 QT Interval:  424 QTC Calculation: 371 R Axis:   84 Text Interpretation:  Ectopic atrial bradycardia Left atrial enlargement  ST elevation likely J point No significant change since last tracing  Confirmed by YAO  MD, DAVID ([id number]) on 04/05/2014 4:19:47 PM        Richardean Canal, MD 04/06/14 1102

## 2014-07-19 ENCOUNTER — Encounter (HOSPITAL_COMMUNITY): Payer: Self-pay | Admitting: *Deleted

## 2014-07-19 ENCOUNTER — Emergency Department (HOSPITAL_COMMUNITY)
Admission: EM | Admit: 2014-07-19 | Discharge: 2014-07-19 | Disposition: A | Discharge status: Home / Self Care | Payer: Medicare Other | Attending: Emergency Medicine | Admitting: Emergency Medicine

## 2014-07-19 DIAGNOSIS — Z720 Tobacco use: Secondary | ICD-10-CM | POA: Insufficient documentation

## 2014-07-19 DIAGNOSIS — R079 Chest pain, unspecified: Secondary | ICD-10-CM | POA: Insufficient documentation

## 2014-07-19 DIAGNOSIS — Z880 Allergy status to penicillin: Secondary | ICD-10-CM | POA: Diagnosis not present

## 2014-07-19 DIAGNOSIS — J069 Acute upper respiratory infection, unspecified: Secondary | ICD-10-CM | POA: Diagnosis not present

## 2014-07-19 DIAGNOSIS — J45909 Unspecified asthma, uncomplicated: Secondary | ICD-10-CM | POA: Diagnosis not present

## 2014-07-19 DIAGNOSIS — Z8709 Personal history of other diseases of the respiratory system: Secondary | ICD-10-CM

## 2014-07-19 DIAGNOSIS — R05 Cough: Secondary | ICD-10-CM | POA: Diagnosis present

## 2014-07-19 DIAGNOSIS — F17200 Nicotine dependence, unspecified, uncomplicated: Secondary | ICD-10-CM

## 2014-07-19 DIAGNOSIS — Z79899 Other long term (current) drug therapy: Secondary | ICD-10-CM | POA: Diagnosis not present

## 2014-07-19 MED ORDER — GUAIFENESIN 100 MG/5ML PO LIQD: 100.0000 mg | ORAL | Status: DC | PRN

## 2014-07-19 MED ORDER — SALINE SPRAY 0.65 % NA SOLN: 1.0000 | NASAL | Status: DC | PRN

## 2014-07-19 MED ORDER — ALBUTEROL SULFATE HFA 108 (90 BASE) MCG/ACT IN AERS
2.0000 | INHALATION_SPRAY | Freq: Once | RESPIRATORY_TRACT | Status: AC
  Administered 2014-07-19: 2 via RESPIRATORY_TRACT
  Filled 2014-07-19: qty 6.7

## 2014-07-19 NOTE — ED Notes (Signed)
Pt in c/o cough and congestion, pain with coughing for the last few days, pt is out of inhaler, no distress noted

## 2014-07-19 NOTE — ED Provider Notes (Signed)
CSN: [id number]     Arrival date & time 07/19/14  1354 History  This chart was scribed for non-physician practitioner, Junius Finner, PA-C working with Glynn Octave, MD by Gwenyth Ober, ED scribe. This patient was seen in room TR08C/TR08C and the patient's care was started at 2:31 PM   Chief Complaint  Patient presents with  . Cough   The history is provided by the patient. No language interpreter was used.   HPI Comments: Gene Rivera is a 34 y.o. male with a history of asthma who presents to the Emergency Department complaining of intermittent cough and congestion that started 2-3 days ago. He states chest pain associated with his cough, light-headedness, intermittent subjective fever and sore throat as associated symptoms. Pt has not tried any treatments at home, but reports he recently ran out of his inhaler. He smokes 2 packs of cigarettes per week. Pt denies ear pain, vomiting and nausea as associated symptoms.  History reviewed. No pertinent past medical history. Past Surgical History  Procedure Laterality Date  . Lung surgery    . Lobectomy Left 1990   History reviewed. No pertinent family history. History  Substance Use Topics  . Smoking status: Current Every Day Smoker -- 0.50 packs/day  . Smokeless tobacco: Not on file  . Alcohol Use: No    Review of Systems  Constitutional: Positive for fever.  HENT: Positive for congestion and sore throat. Negative for ear pain.   Respiratory: Positive for cough.   Cardiovascular: Positive for chest pain.  Gastrointestinal: Negative for nausea and vomiting.  Neurological: Positive for light-headedness.  All other systems reviewed and are negative.     Allergies  Penicillins  Home Medications   Prior to Admission medications   Medication Sig Start Date End Date Taking? Authorizing Provider  albuterol (PROVENTIL HFA;VENTOLIN HFA) 108 (90 BASE) MCG/ACT inhaler Inhale 1-2 puffs into the lungs every 6 (six) hours as needed  for wheezing or shortness of breath.    Historical Provider, MD  albuterol (PROVENTIL HFA;VENTOLIN HFA) 108 (90 BASE) MCG/ACT inhaler Inhale 1-2 puffs into the lungs every 6 (six) hours as needed for wheezing or shortness of breath. 04/05/14   Trixie Dredge, PA-C  guaiFENesin (ROBITUSSIN) 100 MG/5ML liquid Take 5-10 mLs (100-200 mg total) by mouth every 4 (four) hours as needed for cough. 07/19/14   Junius Finner, PA-C  ibuprofen (ADVIL,MOTRIN) 800 MG tablet Take 1 tablet (800 mg total) by mouth every 8 (eight) hours as needed for mild pain or moderate pain. 04/05/14   Trixie Dredge, PA-C  omeprazole (PRILOSEC) 20 MG capsule Take 1 capsule (20 mg total) by mouth daily. 03/17/14   Hannah Muthersbaugh, PA-C  sodium chloride (OCEAN) 0.65 % SOLN nasal spray Place 1 spray into both nostrils as needed for congestion. 07/19/14   Junius Finner, PA-C   BP 126/62 mmHg  Pulse 90  Temp(Src) 98.8 F (37.1 C) (Oral)  Resp 20  SpO2 100% Physical Exam  Constitutional: He is oriented to person, place, and time. He appears well-developed and well-nourished.  HENT:  Head: Normocephalic and atraumatic.  Right Ear: Hearing, tympanic membrane, external ear and ear canal normal.  Left Ear: Hearing, tympanic membrane, external ear and ear canal normal.  Nose: Mucosal edema present.  Mouth/Throat: Uvula is midline, oropharynx is clear and moist and mucous membranes are normal.  Eyes: EOM are normal.  Neck: Normal range of motion.  Cardiovascular: Normal rate and regular rhythm.   Pulmonary/Chest: Effort normal.  Lungs are clear  Musculoskeletal: Normal range of motion.  Neurological: He is alert and oriented to person, place, and time.  Skin: Skin is warm and dry.  Psychiatric: He has a normal mood and affect. His behavior is normal.  Nursing note and vitals reviewed.   ED Course  Procedures (including critical care time) DIAGNOSTIC STUDIES: Oxygen Saturation is 100% on RA, normal by my interpretation.     COORDINATION OF CARE: 2:38 PM Discussed treatment plan with pt at bedside and pt agreed to plan.  Labs Review Labs Reviewed - No data to display  Imaging Review No results found.   EKG Interpretation None      MDM   Final diagnoses:  URI (upper respiratory infection)  History of asthma  Current smoker    pt with hx of asthma, current smoker, presenting to ED with URI symptoms. Pt is afebrile. No respiratory distress. Lungs: CTAB doubt pneumonia.  Will refill inhaler. Rx: guaifenesin and ocean saline nasal spray. Home care instructions provided. Advised to f/u with PCP in 1 week for recheck of symptoms as needed. Return precautions provided. Pt verbalized understanding and agreement with tx plan.   I personally performed the services described in this documentation, which was scribed in my presence. The recorded information has been reviewed and is accurate.    Junius Finnerrin O'Malley, PA-C 07/19/14 1509  Glynn OctaveStephen Rancour, MD 07/19/14 [phone]

## 2014-09-20 ENCOUNTER — Emergency Department (HOSPITAL_COMMUNITY): Payer: Medicare Other

## 2014-09-20 ENCOUNTER — Encounter (HOSPITAL_COMMUNITY): Payer: Self-pay | Admitting: Emergency Medicine

## 2014-09-20 ENCOUNTER — Emergency Department (HOSPITAL_COMMUNITY)
Admission: EM | Admit: 2014-09-20 | Discharge: 2014-09-20 | Disposition: A | Discharge status: Home / Self Care | Payer: Medicare Other | Attending: Emergency Medicine | Admitting: Emergency Medicine

## 2014-09-20 DIAGNOSIS — Z79899 Other long term (current) drug therapy: Secondary | ICD-10-CM | POA: Diagnosis not present

## 2014-09-20 DIAGNOSIS — W208XXA Other cause of strike by thrown, projected or falling object, initial encounter: Secondary | ICD-10-CM | POA: Diagnosis not present

## 2014-09-20 DIAGNOSIS — Y9389 Activity, other specified: Secondary | ICD-10-CM | POA: Diagnosis not present

## 2014-09-20 DIAGNOSIS — L84 Corns and callosities: Secondary | ICD-10-CM

## 2014-09-20 DIAGNOSIS — S9031XA Contusion of right foot, initial encounter: Secondary | ICD-10-CM | POA: Diagnosis not present

## 2014-09-20 DIAGNOSIS — Z880 Allergy status to penicillin: Secondary | ICD-10-CM | POA: Insufficient documentation

## 2014-09-20 DIAGNOSIS — S99921A Unspecified injury of right foot, initial encounter: Secondary | ICD-10-CM | POA: Diagnosis present

## 2014-09-20 DIAGNOSIS — Z720 Tobacco use: Secondary | ICD-10-CM | POA: Diagnosis not present

## 2014-09-20 DIAGNOSIS — Y9289 Other specified places as the place of occurrence of the external cause: Secondary | ICD-10-CM | POA: Diagnosis not present

## 2014-09-20 DIAGNOSIS — Y998 Other external cause status: Secondary | ICD-10-CM | POA: Insufficient documentation

## 2014-09-20 DIAGNOSIS — M79671 Pain in right foot: Secondary | ICD-10-CM | POA: Diagnosis not present

## 2014-09-20 MED ORDER — NAPROXEN 500 MG PO TABS: 500.0000 mg | ORAL_TABLET | Freq: Two times a day (BID) | ORAL | Status: DC

## 2014-09-20 NOTE — Discharge Instructions (Signed)
Naprosyn for pain. You can try over the counter callus removal treatments. Follow up with podiatry   Corns and Calluses Corns are small areas of thickened skin that usually occur on the top, sides, or tip of a toe. They contain a cone-shaped core with a point that can press on a nerve below. This causes pain. Calluses are areas of thickened skin that usually develop on hands, fingers, palms, soles of the feet, and heels. These are areas that experience frequent friction or pressure. CAUSES  Corns are usually the result of rubbing (friction) or pressure from shoes that are too tight or do not fit properly. Calluses are caused by repeated friction and pressure on the affected areas. SYMPTOMS  A hard growth on the skin.  Pain or tenderness under the skin.  Sometimes, redness and swelling.  Increased discomfort while wearing tight-fitting shoes. DIAGNOSIS  Your caregiver can usually tell what the problem is by doing a physical exam. TREATMENT  Removing the cause of the friction or pressure is usually the only treatment needed. However, sometimes medicines can be used to help soften the hardened, thickened areas. These medicines include salicylic acid plasters and 12% ammonium lactate lotion. These medicines should only be used under the direction of your caregiver. HOME CARE INSTRUCTIONS   Try to remove pressure from the affected area.  You may wear donut-shaped corn pads to protect your skin.  You may use a pumice stone or nonmetallic nail file to gently reduce the thickness of a corn.  Wear properly fitted footwear.  If you have calluses on the hands, wear gloves during activities that cause friction.  If you have diabetes, you should regularly examine your feet. Tell your caregiver if you notice any problems with your feet. SEEK IMMEDIATE MEDICAL CARE IF:   You have increased pain, swelling, redness, or warmth in the affected area.  Your corn or callus starts to drain fluid or  bleeds.  You are not getting better, even with treatment. Document Released: 04/05/2004 Document Revised: 09/22/2011 Document Reviewed: 02/25/2011 [facility] Patient Information 2015 [place]. This information is not intended to replace advice given to you by your health care provider. Make sure you discuss any questions you have with your health care provider.  Contusion A contusion is a deep bruise. Contusions are the result of an injury that caused bleeding under the skin. The contusion may turn blue, purple, or yellow. Minor injuries will give you a painless contusion, but more severe contusions may stay painful and swollen for a few weeks.  CAUSES  A contusion is usually caused by a blow, trauma, or direct force to an area of the body. SYMPTOMS   Swelling and redness of the injured area.  Bruising of the injured area.  Tenderness and soreness of the injured area.  Pain. DIAGNOSIS  The diagnosis can be made by taking a history and physical exam. An X-ray, CT scan, or MRI may be needed to determine if there were any associated injuries, such as fractures. TREATMENT  Specific treatment will depend on what area of the body was injured. In general, the best treatment for a contusion is resting, icing, elevating, and applying cold compresses to the injured area. Over-the-counter medicines may also be recommended for pain control. Ask your caregiver what the best treatment is for your contusion. HOME CARE INSTRUCTIONS   Put ice on the injured area.  Put ice in a plastic bag.  Place a towel between your skin and the bag.  Leave  the ice on for 15-20 minutes, 3-4 times a day, or as directed by your health care provider.  Only take over-the-counter or prescription medicines for pain, discomfort, or fever as directed by your caregiver. Your caregiver may recommend avoiding anti-inflammatory medicines (aspirin, ibuprofen, and naproxen) for 48 hours because these medicines may increase  bruising.  Rest the injured area.  If possible, elevate the injured area to reduce swelling. SEEK IMMEDIATE MEDICAL CARE IF:   You have increased bruising or swelling.  You have pain that is getting worse.  Your swelling or pain is not relieved with medicines. MAKE SURE YOU:   Understand these instructions.  Will watch your condition.  Will get help right away if you are not doing well or get worse. Document Released: 04/09/2005 Document Revised: 07/05/2013 Document Reviewed: 05/05/2011 [facility] Patient Information 2015 [place], [place]. This information is not intended to replace advice given to you by your health care provider. Make sure you discuss any questions you have with your health care provider.

## 2014-09-20 NOTE — ED Notes (Signed)
Pt c/o right foot pain. States dropped a sofa on it last pm. No deformity noted. "I'm in a lot of pain".

## 2014-09-20 NOTE — ED Provider Notes (Signed)
CSN: [id number]     Arrival date & time 09/20/14  1246 History   First MD Initiated Contact with Patient 09/20/14 1259     Chief Complaint  Patient presents with  . Foot Injury     (Consider location/radiation/quality/duration/timing/severity/associated sxs/prior Treatment) HPI Gene Rivera is a 34 y.o. male who presents to ED with complaint of bilateral foot pain. States has calluses to bilateral feet that are painful. No tx at home. States also "few days ago," he is not sure when, he dropped a couch onto right foot. Reports pain to the dorsum of the foot. Ambulatory but painful with ambulation. Denies numbness or weakness. No other complaints.   History reviewed. No pertinent past medical history. Past Surgical History  Procedure Laterality Date  . Lung surgery    . Lobectomy Left 1990   No family history on file. History  Substance Use Topics  . Smoking status: Current Every Day Smoker -- 0.50 packs/day  . Smokeless tobacco: Not on file  . Alcohol Use: No    Review of Systems  Constitutional: Negative for fever and chills.  Respiratory: Negative for cough, chest tightness and shortness of breath.   Cardiovascular: Negative for chest pain, palpitations and leg swelling.  Musculoskeletal: Positive for joint swelling and arthralgias. Negative for myalgias, neck pain and neck stiffness.  Skin: Negative for rash.  Neurological: Negative for weakness and numbness.      Allergies  Penicillins  Home Medications   Prior to Admission medications   Medication Sig Start Date End Date Taking? Authorizing Provider  albuterol (PROVENTIL HFA;VENTOLIN HFA) 108 (90 BASE) MCG/ACT inhaler Inhale 1-2 puffs into the lungs every 6 (six) hours as needed for wheezing or shortness of breath.    Historical Provider, MD  albuterol (PROVENTIL HFA;VENTOLIN HFA) 108 (90 BASE) MCG/ACT inhaler Inhale 1-2 puffs into the lungs every 6 (six) hours as needed for wheezing or shortness of breath.  04/05/14   Trixie Dredge, PA-C  guaiFENesin (ROBITUSSIN) 100 MG/5ML liquid Take 5-10 mLs (100-200 mg total) by mouth every 4 (four) hours as needed for cough. 07/19/14   Junius Finner, PA-C  ibuprofen (ADVIL,MOTRIN) 800 MG tablet Take 1 tablet (800 mg total) by mouth every 8 (eight) hours as needed for mild pain or moderate pain. 04/05/14   Trixie Dredge, PA-C  omeprazole (PRILOSEC) 20 MG capsule Take 1 capsule (20 mg total) by mouth daily. 03/17/14   Hannah Muthersbaugh, PA-C  sodium chloride (OCEAN) 0.65 % SOLN nasal spray Place 1 spray into both nostrils as needed for congestion. 07/19/14   Junius Finner, PA-C   BP 108/57 mmHg  Pulse 91  Temp(Src) 98.8 F (37.1 C) (Oral)  Resp 14  SpO2 96% Physical Exam  Constitutional: He appears well-developed and well-nourished. No distress.  Eyes: Conjunctivae are normal.  Neck: Neck supple.  Musculoskeletal:  Multiple calluses to the sella bilateral feet. Tender to palpation. No signs of infection, erythema, warmth to touch. Right dorsal foot diffusely tender over the first through fourth metatarsals. Full range of motion of all toes. Normal ankle exam. No bruising or swelling noted.  Neurological: He is alert.  Skin: Skin is warm and dry.  Nursing note and vitals reviewed.   ED Course  Procedures (including critical care time) Labs Review Labs Reviewed - No data to display  Imaging Review Dg Foot Complete Right  09/20/2014   CLINICAL DATA:  Dropped couch on foot last night. Right foot pain and metatarsal pain.  EXAM: RIGHT FOOT COMPLETE -  3+ VIEW  COMPARISON:  None.  FINDINGS: There is no evidence of fracture or dislocation. There is no evidence of arthropathy or other focal bone abnormality. Soft tissues are unremarkable.  IMPRESSION: Negative.   Electronically Signed   By: Elige KoHetal  Patel   On: 09/20/2014 13:35     EKG Interpretation None      MDM   Final diagnoses:  Foot contusion, right, initial encounter  Plantar callus    PT with calluses to  bilateral feet and right foot pain after dropping a couch on it. xrays negative. No signs of infection. Home with pcp or podiatry follow up. Ice and elevate. Naprosyn for pain  Filed Vitals:   09/20/14 1301  BP: 108/57  Pulse: 91  Temp: 98.8 F (37.1 C)  TempSrc: Oral  Resp: 14  SpO2: 96%       Jaynie Crumbleatyana Menachem Urbanek, PA-C 09/20/14 1641  Derwood KaplanAnkit Nanavati, MD 09/22/14 [phone]

## 2015-02-24 ENCOUNTER — Emergency Department (HOSPITAL_COMMUNITY): Payer: Medicare Other

## 2015-02-24 ENCOUNTER — Emergency Department (HOSPITAL_COMMUNITY)
Admission: EM | Admit: 2015-02-24 | Discharge: 2015-02-24 | Disposition: A | Discharge status: Home / Self Care | Payer: Medicare Other | Attending: Emergency Medicine | Admitting: Emergency Medicine

## 2015-02-24 ENCOUNTER — Encounter (HOSPITAL_COMMUNITY): Payer: Self-pay | Admitting: *Deleted

## 2015-02-24 DIAGNOSIS — Y9289 Other specified places as the place of occurrence of the external cause: Secondary | ICD-10-CM | POA: Diagnosis not present

## 2015-02-24 DIAGNOSIS — R42 Dizziness and giddiness: Secondary | ICD-10-CM | POA: Diagnosis not present

## 2015-02-24 DIAGNOSIS — R079 Chest pain, unspecified: Secondary | ICD-10-CM | POA: Diagnosis not present

## 2015-02-24 DIAGNOSIS — Z79899 Other long term (current) drug therapy: Secondary | ICD-10-CM | POA: Insufficient documentation

## 2015-02-24 DIAGNOSIS — Y998 Other external cause status: Secondary | ICD-10-CM | POA: Insufficient documentation

## 2015-02-24 DIAGNOSIS — Y9389 Activity, other specified: Secondary | ICD-10-CM | POA: Insufficient documentation

## 2015-02-24 DIAGNOSIS — Z720 Tobacco use: Secondary | ICD-10-CM | POA: Insufficient documentation

## 2015-02-24 DIAGNOSIS — S20219A Contusion of unspecified front wall of thorax, initial encounter: Secondary | ICD-10-CM | POA: Diagnosis not present

## 2015-02-24 DIAGNOSIS — R404 Transient alteration of awareness: Secondary | ICD-10-CM | POA: Diagnosis not present

## 2015-02-24 DIAGNOSIS — S0993XA Unspecified injury of face, initial encounter: Secondary | ICD-10-CM | POA: Diagnosis not present

## 2015-02-24 DIAGNOSIS — S0083XA Contusion of other part of head, initial encounter: Secondary | ICD-10-CM | POA: Insufficient documentation

## 2015-02-24 DIAGNOSIS — S20212A Contusion of left front wall of thorax, initial encounter: Secondary | ICD-10-CM | POA: Diagnosis not present

## 2015-02-24 DIAGNOSIS — Z880 Allergy status to penicillin: Secondary | ICD-10-CM | POA: Insufficient documentation

## 2015-02-24 DIAGNOSIS — R6884 Jaw pain: Secondary | ICD-10-CM | POA: Diagnosis not present

## 2015-02-24 DIAGNOSIS — S299XXA Unspecified injury of thorax, initial encounter: Secondary | ICD-10-CM | POA: Diagnosis present

## 2015-02-24 MED ORDER — METHOCARBAMOL 500 MG PO TABS: 500.0000 mg | ORAL_TABLET | Freq: Two times a day (BID) | ORAL | Status: DC

## 2015-02-24 MED ORDER — NAPROXEN 500 MG PO TABS: 500.0000 mg | ORAL_TABLET | Freq: Two times a day (BID) | ORAL | Status: DC

## 2015-02-24 MED ORDER — IBUPROFEN 400 MG PO TABS
800.0000 mg | ORAL_TABLET | Freq: Once | ORAL | Status: AC
  Administered 2015-02-24: 800 mg via ORAL
  Filled 2015-02-24: qty 2
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
## 2015-02-24 NOTE — ED Notes (Signed)
Declined W/C at D/C and was escorted to lobby by RN. 

## 2015-02-24 NOTE — Discharge Instructions (Signed)

## 2015-04-17 ENCOUNTER — Emergency Department (HOSPITAL_COMMUNITY)
Admission: EM | Admit: 2015-04-17 | Discharge: 2015-04-17 | Disposition: A | Discharge status: Home / Self Care | Payer: Medicare Other | Attending: Emergency Medicine | Admitting: Emergency Medicine

## 2015-04-17 ENCOUNTER — Encounter (HOSPITAL_COMMUNITY): Payer: Self-pay | Admitting: Emergency Medicine

## 2015-04-17 ENCOUNTER — Emergency Department (HOSPITAL_COMMUNITY): Payer: Medicare Other

## 2015-04-17 DIAGNOSIS — Z79899 Other long term (current) drug therapy: Secondary | ICD-10-CM | POA: Insufficient documentation

## 2015-04-17 DIAGNOSIS — R05 Cough: Secondary | ICD-10-CM | POA: Diagnosis not present

## 2015-04-17 DIAGNOSIS — Z720 Tobacco use: Secondary | ICD-10-CM | POA: Insufficient documentation

## 2015-04-17 DIAGNOSIS — Z880 Allergy status to penicillin: Secondary | ICD-10-CM | POA: Diagnosis not present

## 2015-04-17 DIAGNOSIS — R059 Cough, unspecified: Secondary | ICD-10-CM

## 2015-04-17 DIAGNOSIS — R079 Chest pain, unspecified: Secondary | ICD-10-CM | POA: Diagnosis not present

## 2015-04-17 MED ORDER — BENZONATATE 100 MG PO CAPS: 100.0000 mg | ORAL_CAPSULE | Freq: Three times a day (TID) | ORAL | Status: DC

## 2015-04-17 NOTE — ED Notes (Signed)
EKG completed given to EDP.  

## 2015-04-17 NOTE — Discharge Instructions (Signed)
Cough, Adult  A cough is a reflex that helps clear your throat and airways. It can help heal the body or may be a reaction to an irritated airway. A cough may only last 2 or 3 weeks (acute) or may last more than 8 weeks (chronic).  CAUSES Acute cough:  Viral or bacterial infections. Chronic cough:  Infections.  Allergies.  Asthma.  Post-nasal drip.  Smoking.  Heartburn or acid reflux.  Some medicines.  Chronic lung problems (COPD).  Cancer. SYMPTOMS   Cough.  Fever.  Chest pain.  Increased breathing rate.  High-pitched whistling sound when breathing (wheezing).  Colored mucus that you cough up (sputum). TREATMENT   A bacterial cough may be treated with antibiotic medicine.  A viral cough must run its course and will not respond to antibiotics.  Your caregiver may recommend other treatments if you have a chronic cough. HOME CARE INSTRUCTIONS   Only take over-the-counter or prescription medicines for pain, discomfort, or fever as directed by your caregiver. Use cough suppressants only as directed by your caregiver.  Use a cold steam vaporizer or humidifier in your bedroom or home to help loosen secretions.  Sleep in a semi-upright position if your cough is worse at night.  Rest as needed.  Stop smoking if you smoke. SEEK IMMEDIATE MEDICAL CARE IF:   You have pus in your sputum.  Your cough starts to worsen.  You cannot control your cough with suppressants and are losing sleep.  You begin coughing up blood.  You have difficulty breathing.  You develop pain which is getting worse or is uncontrolled with medicine.  You have a fever. MAKE SURE YOU:   Understand these instructions.  Will watch your condition.  Will get help right away if you are not doing well or get worse. Document Released: 12/27/2010 Document Revised: 09/22/2011 Document Reviewed: 12/27/2010 [employer] Patient Information 2015 [employer]. This information is not intended  to replace advice given to you by your health care provider. Make sure you discuss any questions you have with your health care provider.  

## 2015-04-17 NOTE — ED Notes (Signed)
Onset one week ago productive cough yellow green sputum with shortness if breath when coughing. Airway intact bilateral equal chest rise and fall.

## 2015-04-17 NOTE — ED Notes (Signed)
Patient was in ED waiting room stated went to the cafeteria.

## 2015-04-17 NOTE — ED Notes (Signed)
Pt no answer for ready room.  

## 2015-04-17 NOTE — ED Provider Notes (Signed)
CSN: [id number]     Arrival date & time 04/17/15  0920 History   First MD Initiated Contact with Patient 04/17/15 [phone]     Chief Complaint  Patient presents with  . Cough     (Consider location/radiation/quality/duration/timing/severity/associated sxs/prior Treatment) HPI Comments: Substernal cp when coughing  Patient is a 34 y.o. male presenting with cough. The history is provided by the patient. No language interpreter was used.  Cough Cough characteristics:  Productive Sputum characteristics:  Yellow Severity:  Moderate Onset quality:  Sudden Timing:  Constant Progression:  Worsening Chronicity:  New Smoker: yes   Relieved by:  Nothing Worsened by:  Nothing tried Associated symptoms: no fever, no shortness of breath and no sore throat     No past medical history on file. Past Surgical History  Procedure Laterality Date  . Lung surgery    . Lobectomy Left 1990   No family history on file. Social History  Substance Use Topics  . Smoking status: Current Every Day Smoker -- 0.50 packs/day  . Smokeless tobacco: Not on file  . Alcohol Use: No    Review of Systems  Constitutional: Negative for fever.  HENT: Negative for sore throat.   Respiratory: Positive for cough. Negative for shortness of breath.   All other systems reviewed and are negative.     Allergies  Penicillins  Home Medications   Prior to Admission medications   Medication Sig Start Date End Date Taking? Authorizing Provider  albuterol (PROVENTIL HFA;VENTOLIN HFA) 108 (90 BASE) MCG/ACT inhaler Inhale 1-2 puffs into the lungs every 6 (six) hours as needed for wheezing or shortness of breath.    Historical Provider, MD  albuterol (PROVENTIL HFA;VENTOLIN HFA) 108 (90 BASE) MCG/ACT inhaler Inhale 1-2 puffs into the lungs every 6 (six) hours as needed for wheezing or shortness of breath. 04/05/14   Trixie Dredge, PA-C  guaiFENesin (ROBITUSSIN) 100 MG/5ML liquid Take 5-10 mLs (100-200 mg total) by mouth every  4 (four) hours as needed for cough. 07/19/14   Junius Finner, PA-C  ibuprofen (ADVIL,MOTRIN) 800 MG tablet Take 1 tablet (800 mg total) by mouth every 8 (eight) hours as needed for mild pain or moderate pain. 04/05/14   Trixie Dredge, PA-C  methocarbamol (ROBAXIN) 500 MG tablet Take 1 tablet (500 mg total) by mouth 2 (two) times daily. 02/24/15   Hanna Patel-Mills, PA-C  naproxen (NAPROSYN) 500 MG tablet Take 1 tablet (500 mg total) by mouth 2 (two) times daily. 02/24/15   Hanna Patel-Mills, PA-C  omeprazole (PRILOSEC) 20 MG capsule Take 1 capsule (20 mg total) by mouth daily. 03/17/14   Hannah Muthersbaugh, PA-C  sodium chloride (OCEAN) 0.65 % SOLN nasal spray Place 1 spray into both nostrils as needed for congestion. 07/19/14   Junius Finner, PA-C   BP 107/67 mmHg  Pulse 78  Temp(Src) 98.3 F (36.8 C) (Oral)  Resp 16  Ht  (1.753 m)  Wt 150 lb (68.04 kg)  BMI 22.14 kg/m2  SpO2 99% Physical Exam  Constitutional: He is oriented to person, place, and time. He appears well-developed and well-nourished.  HENT:  Head: Normocephalic and atraumatic.  Right Ear: External ear normal.  Left Ear: External ear normal.  Eyes: Conjunctivae and EOM are normal. Pupils are equal, round, and reactive to light.  Neck: Normal range of motion. Neck supple.  Cardiovascular: Normal rate and regular rhythm.   Pulmonary/Chest: Effort normal and breath sounds normal.  Abdominal: Soft. Bowel sounds are normal. There is no tenderness.  Musculoskeletal:  Normal range of motion.  Neurological: He is alert and oriented to person, place, and time.  Skin: Skin is warm and dry.  Psychiatric: He has a normal mood and affect.  Nursing note and vitals reviewed.   ED Course  Procedures (including critical care time) Labs Review Labs Reviewed - No data to display  Imaging Review Dg Chest 2 View  04/17/2015   CLINICAL DATA:  Mid chest pain for a few days.  Cough, smoker.  EXAM: CHEST  2 VIEW  COMPARISON:  02/24/2015   FINDINGS: Heart and mediastinal contours are within normal limits. No focal opacities or effusions. No acute bony abnormality.  IMPRESSION: No active cardiopulmonary disease.   Electronically Signed   By: Charlett Nose M.D.   On: 04/17/2015 11:50   I have personally reviewed and evaluated these images and lab results as part of my medical decision-making.   EKG Interpretation   Date/Time:  Tuesday April 17 2015 10:26:28 EDT Ventricular Rate:  72 PR Interval:  175 QRS Duration: 78 QT Interval:  366 QTC Calculation: 400 R Axis:   81 Text Interpretation:  Sinus rhythm Probable left atrial enlargement ST  elevation diffusely, no significant change from prior Confirmed by Mirian Mo [phone]) on 04/17/2015 10:29:42 AM      MDM   Final diagnoses:  Cough    No infectious process noted on xray. Likely viral uri. Will treat with cough medications    Teressa Lower, NP 04/17/15 1200  Mirian Mo, MD 04/19/15 1534

## 2015-12-12 ENCOUNTER — Encounter (HOSPITAL_COMMUNITY): Payer: Self-pay | Admitting: *Deleted

## 2015-12-12 ENCOUNTER — Ambulatory Visit (HOSPITAL_COMMUNITY)
Admission: EM | Admit: 2015-12-12 | Discharge: 2015-12-12 | Disposition: A | Discharge status: Home / Self Care | Payer: Medicare Other | Attending: Emergency Medicine | Admitting: Emergency Medicine

## 2015-12-12 DIAGNOSIS — Z9189 Other specified personal risk factors, not elsewhere classified: Secondary | ICD-10-CM

## 2015-12-12 DIAGNOSIS — K029 Dental caries, unspecified: Secondary | ICD-10-CM

## 2015-12-12 DIAGNOSIS — K0510 Chronic gingivitis, plaque induced: Secondary | ICD-10-CM | POA: Diagnosis not present

## 2015-12-12 DIAGNOSIS — K089 Disorder of teeth and supporting structures, unspecified: Secondary | ICD-10-CM | POA: Diagnosis not present

## 2015-12-12 DIAGNOSIS — G8929 Other chronic pain: Secondary | ICD-10-CM | POA: Diagnosis not present

## 2015-12-12 MED ORDER — CLINDAMYCIN HCL 300 MG PO CAPS: 300.0000 mg | ORAL_CAPSULE | Freq: Three times a day (TID) | ORAL | Status: DC

## 2015-12-12 MED ORDER — HYDROCODONE-ACETAMINOPHEN 5-325 MG PO TABS: 1.0000 | ORAL_TABLET | ORAL | Status: DC | PRN

## 2015-12-12 NOTE — Discharge Instructions (Signed)
Dental Caries °Dental caries (also called tooth decay) is the most common oral disease. It can occur at any age but is more common in children and young adults.  °HOW DENTAL CARIES DEVELOPS  °The process of decay begins when bacteria and foods (particularly sugars and starches) combine in your mouth to produce plaque. Plaque is a substance that sticks to the hard, outer surface of a tooth (enamel). The bacteria in plaque produce acids that attack enamel. These acids may also attack the root surface of a tooth (cementum) if it is exposed. Repeated attacks dissolve these surfaces and create holes in the tooth (cavities). If left untreated, the acids destroy the other layers of the tooth.  °RISK FACTORS °· Frequent sipping of sugary beverages.   °· Frequent snacking on sugary and starchy foods, especially those that easily get stuck in the teeth.   °· Poor oral hygiene.   °· Dry mouth.   °· Substance abuse such as methamphetamine abuse.   °· Broken or poor-fitting dental restorations.   °· Eating disorders.   °· Gastroesophageal reflux disease (GERD).   °· Certain radiation treatments to the head and neck. °SYMPTOMS °In the early stages of dental caries, symptoms are seldom present. Sometimes white, chalky areas may be seen on the enamel or other tooth layers. In later stages, symptoms may include: °· Pits and holes on the enamel. °· Toothache after sweet, hot, or cold foods or drinks are consumed. °· Pain around the tooth. °· Swelling around the tooth. °DIAGNOSIS  °Most of the time, dental caries is detected during a regular dental checkup. A diagnosis is made after a thorough medical and dental history is taken and the surfaces of your teeth are checked for signs of dental caries. Sometimes special instruments, such as lasers, are used to check for dental caries. Dental X-ray exams may be taken so that areas not visible to the eye (such as between the contact areas of the teeth) can be checked for cavities.    °TREATMENT  °If dental caries is in its early stages, it may be reversed with a fluoride treatment or an application of a remineralizing agent at the dental office. Thorough brushing and flossing at home is needed to aid these treatments. If it is in its later stages, treatment depends on the location and extent of tooth destruction:  °· If a small area of the tooth has been destroyed, the destroyed area will be removed and cavities will be filled with a material such as gold, silver amalgam, or composite resin.   °· If a large area of the tooth has been destroyed, the destroyed area will be removed and a cap (crown) will be fitted over the remaining tooth structure.   °· If the center part of the tooth (pulp) is affected, a procedure called a root canal will be needed before a filling or crown can be placed.   °· If most of the tooth has been destroyed, the tooth may need to be pulled (extracted). °HOME CARE INSTRUCTIONS °You can prevent, stop, or reverse dental caries at home by practicing good oral hygiene. Good oral hygiene includes: °· Thoroughly cleaning your teeth at least twice a day with a toothbrush and dental floss.   °· Using a fluoride toothpaste. A fluoride mouth rinse may also be used if recommended by your dentist or health care provider.   °· Restricting the amount of sugary and starchy foods and sugary liquids you consume.   °· Avoiding frequent snacking on these foods and sipping of these liquids.   °· Keeping regular visits with   a dentist for checkups and cleanings. PREVENTION   Practice good oral hygiene.  Consider a dental sealant. A dental sealant is a coating material that is applied by your dentist to the pits and grooves of teeth. The sealant prevents food from being trapped in them. It may protect the teeth for several years.  Ask about fluoride supplements if you live in a community without fluorinated water or with water that has a low fluoride content. Use fluoride supplements  as directed by your dentist or health care provider.  Allow fluoride varnish applications to teeth if directed by your dentist or health care provider.   This information is not intended to replace advice given to you by your health care provider. Make sure you discuss any questions you have with your health care provider.   Document Released: 03/22/2002 Document Revised: 07/21/2014 Document Reviewed: 07/02/2012 Elsevier Interactive Patient Education 2016 Else[employer] Inc[employer]  Dental Caries Dental caries is tooth decay. This decay can cause a hole in teeth (cavity) that can get bigger and deeper over time. HOME CARE  Brush and floss your teeth. Do this at least two times a day.  Use a fluoride toothpaste.  Use a mouth rinse if told by your dentist or doctor.  Eat less sugary and starchy foods. Drink less sugary drinks.  Avoid snacking often on sugary and starchy foods. Avoid sipping often on sugary drinks.  Keep regular checkups and cleanings with your dentist.  Use fluoride supplements if told by your dentist or doctor.  Allow fluoride to be applied to teeth if told by your dentist or doctor.   This information is not intended to replace advice given to you by your health care provider. Make sure you discuss any questions you have with your health care provider.   Document Released: 04/08/2008 Document Revised: 07/21/2014 Document Reviewed: 07/02/2012 Elsevier Interactive Patient Education 2016 Elsevier Inc.  Dental Pain Dental pain may be caused by many things, including:  Tooth decay (cavities or caries). Cavities expose the nerve of your tooth to air and hot or cold temperatures. This can cause pain or discomfort.  Abscess or infection. A dental abscess is a collection of infected pus from a bacterial infection in the inner part of the tooth (pulp). It usually occurs at the end of the tooth's root.  Injury.  An unknown reason (idiopathic). Your pain may be mild or severe.  It may only occur when:  You are chewing.  You are exposed to hot or cold temperature.  You are eating or drinking sugary foods or beverages, such as soda or candy. Your pain may also be constant. HOME CARE INSTRUCTIONS Watch your dental pain for any changes. The following actions may help to lessen any discomfort that you are feeling:  Take medicines only as directed by your dentist.  If you were prescribed an antibiotic medicine, finish all of it even if you start to feel better.  Keep all follow-up visits as directed by your dentist. This is important.  Do not apply heat to the outside of your face.  Rinse your mouth or gargle with salt water if directed by your dentist. This helps with pain and swelling.  You can make salt water by adding  tsp of salt to 1 cup of warm water.  Apply ice to the painful area of your face:  Put ice in a plastic bag.  Place a towel between your skin and the bag.  Leave the ice on for 20 minutes, 2-3  times per day.  Avoid foods or drinks that cause you pain, such as:  Very hot or very cold foods or drinks.  Sweet or sugary foods or drinks. SEEK MEDICAL CARE IF:  Your pain is not controlled with medicines.  Your symptoms are worse.  You have new symptoms. SEEK IMMEDIATE MEDICAL CARE IF:  You are unable to open your mouth.  You are having trouble breathing or swallowing.  You have a fever.  Your face, neck, or jaw is swollen.   This information is not intended to replace advice given to you by your health care provider. Make sure you discuss any questions you have with your health care provider.   Document Released: 06/30/2005 Document Revised: 11/14/2014 Document Reviewed: 06/26/2014 Elsevier Interactive Patient Education [employer]2016 Elsevier Inc.

## 2015-12-12 NOTE — ED Notes (Signed)
Pt  Reports   Toothache r  Lower molar   Area      Has  Had the problem for  A   While  But  The  Symptoms  Became  Worse yesterday      He   States   He   Pain  Not  releived  By  OTC

## 2015-12-12 NOTE — ED Provider Notes (Signed)
CSN: [id number]     Arrival date & time 12/12/15  1345 History   First MD Initiated Contact with Patient 12/12/15 1445     Chief Complaint  Patient presents with  . Dental Pain   (Consider location/radiation/quality/duration/timing/severity/associated sxs/prior Treatment) HPI Comments: 35 year old male complaining of multiple toothaches. He has acute on chronic tooth pain. He has had very little dental care in the past. He complains of pain in most all of the remaining teeth and along the gumlines. Denies fever or chills. He states he called a dentist office today and was told he can get an appointment in 3 weeks   History reviewed. No pertinent past medical history. Past Surgical History  Procedure Laterality Date  . Lung surgery    . Lobectomy Left 1990   History reviewed. No pertinent family history. Social History  Substance Use Topics  . Smoking status: Current Every Day Smoker -- 0.50 packs/day  . Smokeless tobacco: None  . Alcohol Use: No    Review of Systems  Constitutional: Negative.  Negative for fever.  HENT: Positive for dental problem. Negative for congestion, drooling, facial swelling and postnasal drip.   Respiratory: Negative.   Musculoskeletal: Negative.   Skin: Negative.   Neurological: Negative.   All other systems reviewed and are negative.   Allergies  Penicillins  Home Medications   Prior to Admission medications   Medication Sig Start Date End Date Taking? Authorizing Provider  albuterol (PROVENTIL HFA;VENTOLIN HFA) 108 (90 BASE) MCG/ACT inhaler Inhale 1-2 puffs into the lungs every 6 (six) hours as needed for wheezing or shortness of breath. 04/05/14   Trixie DredgeEmily West, PA-C  benzonatate (TESSALON) 100 MG capsule Take 1 capsule (100 mg total) by mouth every 8 (eight) hours. 04/17/15   Teressa LowerVrinda Pickering, NP  clindamycin (CLEOCIN) 300 MG capsule Take 1 capsule (300 mg total) by mouth 3 (three) times daily. 12/12/15   Hayden Rasmussenavid Reed Eifert, NP  HYDROcodone-acetaminophen  (NORCO/VICODIN) 5-325 MG tablet Take 1 tablet by mouth every 4 (four) hours as needed. 12/12/15   Hayden Rasmussenavid Latrese Carolan, NP  ibuprofen (ADVIL,MOTRIN) 800 MG tablet Take 1 tablet (800 mg total) by mouth every 8 (eight) hours as needed for mild pain or moderate pain. 04/05/14   Trixie DredgeEmily West, PA-C  sodium chloride (OCEAN) 0.65 % SOLN nasal spray Place 1 spray into both nostrils as needed for congestion. 07/19/14   Junius FinnerErin O'Malley, PA-C   Meds Ordered and Administered this Visit  Medications - No data to display  BP 134/74 mmHg  Pulse 78  Temp(Src) 98.6 F (37 C) (Oral)  Resp 18  SpO2 100% No data found.   Physical Exam  Constitutional: He is oriented to person, place, and time. He appears well-developed and well-nourished. No distress.  HENT:  Mouth/Throat: Oropharynx is clear and moist.  Very poor dental repair. There are several teeth with large cavities, fractures and areas of decay. Some teeth are did needed to the level of the gingiva. Most of his teeth are tender. Minor gingival erythema and swelling. No single area that appears to have an abscess or is more painful than other areas.  Eyes: EOM are normal.  Neck: Normal range of motion. Neck supple.  Cardiovascular: Normal rate.   Pulmonary/Chest: Effort normal.  Neurological: He is alert and oriented to person, place, and time. He exhibits normal muscle tone.  Skin: Skin is warm and dry.  Nursing note and vitals reviewed.   ED Course  Procedures (including critical care time)  Labs Review Labs Reviewed -  No data to display  Imaging Review No results found.   Visual Acuity Review  Right Eye Distance:   Left Eye Distance:   Bilateral Distance:    Right Eye Near:   Left Eye Near:    Bilateral Near:         MDM   1. Poor dental hygiene   2. Chronic dental pain   3. Dental caries noted on examination   4. Gingivitis, chronic    Frequent warm salt water gargles Meds ordered this encounter  Medications  .  HYDROcodone-acetaminophen (NORCO/VICODIN) 5-325 MG tablet    Sig: Take 1 tablet by mouth every 4 (four) hours as needed.    Dispense:  15 tablet    Refill:  0    Order Specific Question:  Supervising Provider    Answer:  Charm Rings [id number]  . clindamycin (CLEOCIN) 300 MG capsule    Sig: Take 1 capsule (300 mg total) by mouth 3 (three) times daily.    Dispense:  21 capsule    Refill:  0    Order Specific Question:  Supervising Provider    Answer:  Charm Rings [[id number]]   See dentist ASAP    Hayden Rasmussen, NP 12/12/15 1503

## 2016-08-06 ENCOUNTER — Ambulatory Visit (HOSPITAL_COMMUNITY)
Admission: EM | Admit: 2016-08-06 | Discharge: 2016-08-06 | Disposition: A | Discharge status: Home / Self Care | Payer: Medicare Other | Attending: Emergency Medicine | Admitting: Emergency Medicine

## 2016-08-06 ENCOUNTER — Encounter (HOSPITAL_COMMUNITY): Payer: Self-pay | Admitting: Emergency Medicine

## 2016-08-06 DIAGNOSIS — J069 Acute upper respiratory infection, unspecified: Secondary | ICD-10-CM

## 2016-08-06 DIAGNOSIS — Z720 Tobacco use: Secondary | ICD-10-CM | POA: Diagnosis not present

## 2016-08-06 DIAGNOSIS — J9801 Acute bronchospasm: Secondary | ICD-10-CM

## 2016-08-06 MED ORDER — PREDNISONE 50 MG PO TABS: ORAL_TABLET | ORAL | 0 refills | Status: DC

## 2016-08-06 MED ORDER — ALBUTEROL SULFATE HFA 108 (90 BASE) MCG/ACT IN AERS: 2.0000 | INHALATION_SPRAY | RESPIRATORY_TRACT | 0 refills | Status: DC | PRN

## 2016-08-06 NOTE — ED Triage Notes (Signed)
Pt c/o cold sx onset: yest night  Sx include: prod cough, fevers, chills, BA, nasal congestion/drainage, abd pain, diarrhea  Taking OTC cough meds w/no relief.    A&O x4... NAD

## 2016-08-06 NOTE — ED Provider Notes (Signed)
CSN: [id number]     Arrival date & time 08/06/16  4098 History   First MD Initiated Contact with Patient 08/06/16 1035     Chief Complaint  Patient presents with  . URI   (Consider location/radiation/quality/duration/timing/severity/associated sxs/prior Treatment) 36 year old male smoker complaining of runny nose, lightheadedness, feeling warm, sore throat, PND, runny nose. Respiratory congestion since last night. Vomited once after eating [place] food. Afebrile. Current vital signs are within normal limits.      History reviewed. No pertinent past medical history. Past Surgical History:  Procedure Laterality Date  . LOBECTOMY Left 1990  . LUNG SURGERY     History reviewed. No pertinent family history. Social History  Substance Use Topics  . Smoking status: Current Every Day Smoker    Packs/day: 0.50  . Smokeless tobacco: Never Used  . Alcohol use No    Review of Systems  Constitutional: Positive for activity change. Negative for diaphoresis, fatigue and fever.  HENT: Positive for congestion, postnasal drip, rhinorrhea and sore throat. Negative for ear pain, facial swelling and trouble swallowing.   Eyes: Negative for pain, discharge and redness.  Respiratory: Positive for cough. Negative for chest tightness and shortness of breath.   Cardiovascular: Negative.   Gastrointestinal: Positive for vomiting.  Musculoskeletal: Negative.  Negative for neck pain and neck stiffness.  Neurological: Negative.     Allergies  Penicillins  Home Medications   Prior to Admission medications   Medication Sig Start Date End Date Taking? Authorizing Provider  albuterol (PROVENTIL HFA;VENTOLIN HFA) 108 (90 Base) MCG/ACT inhaler Inhale 2 puffs into the lungs every 4 (four) hours as needed for wheezing or shortness of breath. 08/06/16   Hayden Rasmussen, NP  benzonatate (TESSALON) 100 MG capsule Take 1 capsule (100 mg total) by mouth every 8 (eight) hours. 04/17/15   Teressa Lower, NP  ibuprofen  (ADVIL,MOTRIN) 800 MG tablet Take 1 tablet (800 mg total) by mouth every 8 (eight) hours as needed for mild pain or moderate pain. 04/05/14   Trixie Dredge, PA-C  predniSONE (DELTASONE) 50 MG tablet 1 tab po on day 1, one tab po on day 2, 1/2 tab po on days 3 and 4 08/06/16   Hayden Rasmussen, NP  sodium chloride (OCEAN) 0.65 % SOLN nasal spray Place 1 spray into both nostrils as needed for congestion. 07/19/14   Junius Finner, PA-C   Meds Ordered and Administered this Visit  Medications - No data to display  BP 128/76 (BP Location: Right Arm)   Pulse 66   Temp 98.5 F (36.9 C) (Oral)   Resp 16   SpO2 100%  No data found.   Physical Exam  Constitutional: He is oriented to person, place, and time. He appears well-developed and well-nourished. No distress.  HENT:  Right Ear: External ear normal.  Left Ear: External ear normal.  Bilateral TMs are normal. Oropharynx with minor erythema, moderate amount of clear PND and cobblestoning. No exudates.  Eyes: EOM are normal. Pupils are equal, round, and reactive to light.  Neck: Normal range of motion. Neck supple.  Cardiovascular: Normal rate, regular rhythm, normal heart sounds and intact distal pulses.   Pulmonary/Chest: Effort normal. No respiratory distress. He has no rales.  Fair to good air movement. Expiratory wheezes.  Musculoskeletal: Normal range of motion. He exhibits no edema.  Lymphadenopathy:    He has no cervical adenopathy.  Neurological: He is alert and oriented to person, place, and time.  Skin: Skin is warm and dry.  Nursing note and  vitals reviewed.   Urgent Care Course     Procedures (including critical care time)  Labs Review Labs Reviewed - No data to display  Imaging Review No results found.   Visual Acuity Review  Right Eye Distance:   Left Eye Distance:   Bilateral Distance:    Right Eye Near:   Left Eye Near:    Bilateral Near:         MDM   1. Acute upper respiratory infection   2. Bronchospasm    3. Tobacco abuse disorder    Sudafed PE 10 mg every 4 to 6 hours as needed for congestion Allegra or Zyrtec daily as needed for drainage and runny nose. For stronger antihistamine may take Chlor-Trimeton 2 to 4 mg every 4 to 6 hours, may cause drowsiness. Saline nasal spray used frequently. Ibuprofen 600 mg every 6 hours as needed for pain, discomfort or fever. Drink plenty of fluids and stay well-hydrated. Meds ordered this encounter  Medications  . albuterol (PROVENTIL HFA;VENTOLIN HFA) 108 (90 Base) MCG/ACT inhaler    Sig: Inhale 2 puffs into the lungs every 4 (four) hours as needed for wheezing or shortness of breath.    Dispense:  1 Inhaler    Refill:  0    Order Specific Question:   Supervising Provider    Answer:   Charm RingsHONIG, ERIN J [id number]]  . predniSONE (DELTASONE) 50 MG tablet    Sig: 1 tab po on day 1, one tab po on day 2, 1/2 tab po on days 3 and 4    Dispense:  3 tablet    Refill:  0    Order Specific Question:   Supervising Provider    Answer:   Micheline ChapmanHONIG, ERIN J [[id number]]       Hayden Rasmussenavid Jalon Squier, NP 08/06/16 1052

## 2016-08-06 NOTE — Discharge Instructions (Signed)
Sudafed PE 10 mg every 4 to 6 hours as needed for congestion °Allegra or Zyrtec daily as needed for drainage and runny nose. °For stronger antihistamine may take Chlor-Trimeton 2 to 4 mg every 4 to 6 hours, may cause drowsiness. °Saline nasal spray used frequently. °Ibuprofen 600 mg every 6 hours as needed for pain, discomfort or fever. °Drink plenty of fluids and stay well-hydrated. °

## 2016-09-25 ENCOUNTER — Encounter (HOSPITAL_COMMUNITY): Payer: Self-pay | Admitting: Emergency Medicine

## 2016-09-25 ENCOUNTER — Emergency Department (HOSPITAL_COMMUNITY)
Admission: EM | Admit: 2016-09-25 | Discharge: 2016-09-25 | Discharge status: Against Medical Advice | Payer: Medicare Other

## 2016-09-25 DIAGNOSIS — R0789 Other chest pain: Secondary | ICD-10-CM | POA: Diagnosis not present

## 2016-09-25 HISTORY — DX: Bipolar disorder, unspecified: F31.9

## 2016-09-25 NOTE — ED Triage Notes (Signed)
Pt reports CP starting after walking for several hours. Pt reports central pain 5/10. Refused IV access. 324 MG aspirin given by EMS.

## 2016-09-25 NOTE — ED Notes (Signed)
Pt eloped from lobby. Went to use bathroom after coming in from ambulance, staff unable to locate in ED. LWBS before VS, EKG, or lab work could be drawn.

## 2017-05-05 ENCOUNTER — Encounter (HOSPITAL_COMMUNITY): Payer: Self-pay | Admitting: Emergency Medicine

## 2017-05-05 ENCOUNTER — Ambulatory Visit (HOSPITAL_COMMUNITY)
Admission: EM | Admit: 2017-05-05 | Discharge: 2017-05-05 | Disposition: A | Discharge status: Home / Self Care | Payer: Medicare Other | Attending: Emergency Medicine | Admitting: Emergency Medicine

## 2017-05-05 DIAGNOSIS — R05 Cough: Secondary | ICD-10-CM

## 2017-05-05 DIAGNOSIS — J069 Acute upper respiratory infection, unspecified: Secondary | ICD-10-CM | POA: Diagnosis not present

## 2017-05-05 DIAGNOSIS — R059 Cough, unspecified: Secondary | ICD-10-CM

## 2017-05-05 MED ORDER — PREDNISONE 10 MG (21) PO TBPK: ORAL_TABLET | Freq: Every day | ORAL | 0 refills | Status: DC

## 2017-05-05 MED ORDER — AZITHROMYCIN 250 MG PO TABS: 250.0000 mg | ORAL_TABLET | Freq: Every day | ORAL | 0 refills | Status: DC

## 2017-05-05 MED ORDER — ALBUTEROL SULFATE HFA 108 (90 BASE) MCG/ACT IN AERS: 1.0000 | INHALATION_SPRAY | Freq: Four times a day (QID) | RESPIRATORY_TRACT | 0 refills | Status: DC | PRN

## 2017-05-05 NOTE — ED Provider Notes (Signed)
[facility]    CSN: [id number] Arrival date & time: 05/05/17  1840     History   Chief Complaint Chief Complaint  Patient presents with  . URI    HPI Gene Rivera is a 36 y.o. male.   Pt here for cough, congestion, fever, sob for the past 3 days now. 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 States that he has taken some otc meds with no relief.       Past Medical History:  Diagnosis Date  . Bipolar affective disorder (HCC)     There are no active problems to display for this patient.   Past Surgical History:  Procedure Laterality Date  . LOBECTOMY Left 1990  . LUNG SURGERY         Home Medications    Prior to Admission medications   Medication Sig Start Date End Date Taking? Authorizing Provider  albuterol (PROVENTIL HFA;VENTOLIN HFA) 108 (90 Base) MCG/ACT inhaler Inhale 1-2 puffs into the lungs every 6 (six) hours as needed for wheezing or shortness of breath. 05/05/17   Coralyn Mark, NP  azithromycin (ZITHROMAX) 250 MG tablet Take 1 tablet (250 mg total) by mouth daily. Take first 2 tablets together, then 1 every day until finished. 05/05/17   Coralyn Mark, NP  benzonatate (TESSALON) 100 MG capsule Take 1 capsule (100 mg total) by mouth every 8 (eight) hours. 04/17/15   Teressa Lower, NP  ibuprofen (ADVIL,MOTRIN) 800 MG tablet Take 1 tablet (800 mg total) by mouth every 8 (eight) hours as needed for mild pain or moderate pain. 04/05/14   Trixie Dredge, PA-C  predniSONE (STERAPRED UNI-PAK 21 TAB) 10 MG (21) TBPK tablet Take by mouth daily. Take 6 tabs by mouth daily  for 2 days, then 5 tabs for 2 days, then 4 tabs for 2 days, then 3 tabs for 2 days, 2 tabs for 2 days, then 1 tab by mouth daily for 2 days 05/05/17   Coralyn Mark, NP  sodium chloride (OCEAN) 0.65 % SOLN nasal spray Place 1 spray into both nostrils as needed for congestion. 07/19/14   Lurene Shadow, PA-C    Family History History  reviewed. No pertinent family history.  Social History Social History  Substance Use Topics  . Smoking status: Current Every Day Smoker    Packs/day: 0.50  . Smokeless tobacco: Never Used  . Alcohol use No     Allergies   Penicillins   Review of Systems Review of Systems  Constitutional: Positive for chills.  HENT: Negative.   Eyes: Negative.   Respiratory: Positive for cough and shortness of breath.   Cardiovascular: Negative.   Gastrointestinal: Negative.   Genitourinary: Negative.   Musculoskeletal: Negative.   Skin: Negative.   Neurological: Negative.      Physical Exam Triage Vital Signs ED Triage Vitals [05/05/17 1859]  Enc Vitals Group     BP 117/67     Pulse Rate 85     Resp 18     Temp 98.9 F (37.2 C)     Temp Source Oral     SpO2 99 %     Weight      Height      Head Circumference      Peak Flow      Pain Score 10     Pain Loc      Pain Edu?      Excl.  in GC?    No data found.   Updated Vital Signs BP 117/67 (BP Location: Right Arm)   Pulse 85   Temp 98.9 F (37.2 C) (Oral)   Resp 18   SpO2 99%   Visual Acuity Right Eye Distance:   Left Eye Distance:   Bilateral Distance:    Right Eye Near:   Left Eye Near:    Bilateral Near:     Physical Exam  Constitutional: He appears well-developed.  HENT:  Head: Normocephalic.  Right Ear: External ear normal.  Left Ear: External ear normal.  Mouth/Throat: Oropharynx is clear and moist.  Eyes: Pupils are equal, round, and reactive to light.  Neck: Normal range of motion.  Cardiovascular: Normal rate and regular rhythm.   Pulmonary/Chest: Breath sounds normal.  LUL removed as a child ,   Abdominal: Soft. Bowel sounds are normal.  Neurological: He is alert.  Skin: Skin is warm. Capillary refill takes less than 2 seconds.     UC Treatments / Results  Labs (all labs ordered are listed, but only abnormal results are displayed) Labs Reviewed - No data to display  EKG  EKG  Interpretation None       Radiology No results found.  Procedures Procedures (including critical care time)  Medications Ordered in UC Medications - No data to display   Initial Impression / Assessment and Plan / UC Course  I have reviewed the triage vital signs and the nursing notes.  Pertinent labs & imaging results that were available during my care of the patient were reviewed by me and considered in my medical decision making (see chart for details).    Drink fluids May use inhaler as needed If you have increase sob go to the ER   Final Clinical Impressions(s) / UC Diagnoses   Final diagnoses:  Cough  Viral upper respiratory tract infection  Acute upper respiratory infection    New Prescriptions New Prescriptions   ALBUTEROL (PROVENTIL HFA;VENTOLIN HFA) 108 (90 BASE) MCG/ACT INHALER    Inhale 1-2 puffs into the lungs every 6 (six) hours as needed for wheezing or shortness of breath.   AZITHROMYCIN (ZITHROMAX) 250 MG TABLET    Take 1 tablet (250 mg total) by mouth daily. Take first 2 tablets together, then 1 every day until finished.   PREDNISONE (STERAPRED UNI-PAK 21 TAB) 10 MG (21) TBPK TABLET    Take by mouth daily. Take 6 tabs by mouth daily  for 2 days, then 5 tabs for 2 days, then 4 tabs for 2 days, then 3 tabs for 2 days, 2 tabs for 2 days, then 1 tab by mouth daily for 2 days     Controlled Substance Prescriptions [place] Controlled Substance Registry consulted? Not Applicable   Coralyn MarkMitchell, Dawit Tankard L, NP 05/05/17 2015

## 2017-05-05 NOTE — ED Triage Notes (Signed)
Pt sts URI sx with body aches and fever x 2 days

## 2017-08-05 ENCOUNTER — Emergency Department (HOSPITAL_COMMUNITY)
Admission: EM | Admit: 2017-08-05 | Discharge: 2017-08-06 | Disposition: A | Discharge status: Home / Self Care | Payer: Medicare Other | Attending: Emergency Medicine | Admitting: Emergency Medicine

## 2017-08-05 ENCOUNTER — Encounter (HOSPITAL_COMMUNITY): Payer: Self-pay | Admitting: Family Medicine

## 2017-08-05 DIAGNOSIS — J189 Pneumonia, unspecified organism: Secondary | ICD-10-CM

## 2017-08-05 DIAGNOSIS — J181 Lobar pneumonia, unspecified organism: Secondary | ICD-10-CM | POA: Insufficient documentation

## 2017-08-05 DIAGNOSIS — F12188 Cannabis abuse with other cannabis-induced disorder: Secondary | ICD-10-CM | POA: Insufficient documentation

## 2017-08-05 DIAGNOSIS — R197 Diarrhea, unspecified: Secondary | ICD-10-CM | POA: Insufficient documentation

## 2017-08-05 DIAGNOSIS — F17210 Nicotine dependence, cigarettes, uncomplicated: Secondary | ICD-10-CM | POA: Insufficient documentation

## 2017-08-05 DIAGNOSIS — R112 Nausea with vomiting, unspecified: Secondary | ICD-10-CM | POA: Diagnosis not present

## 2017-08-05 DIAGNOSIS — R1032 Left lower quadrant pain: Secondary | ICD-10-CM | POA: Diagnosis present

## 2017-08-05 DIAGNOSIS — R109 Unspecified abdominal pain: Secondary | ICD-10-CM | POA: Diagnosis not present

## 2017-08-05 LAB — COMPREHENSIVE METABOLIC PANEL
ALBUMIN: 4 g/dL (ref 3.5–5.0)
ALK PHOS: 52 U/L (ref 38–126)
ALT: 21 U/L (ref 17–63)
AST: 31 U/L (ref 15–41)
Anion gap: 12 (ref 5–15)
BILIRUBIN TOTAL: 1.6 mg/dL — AB (ref 0.3–1.2)
BUN: 9 mg/dL (ref 6–20)
CO2: 22 mmol/L (ref 22–32)
CREATININE: 1.13 mg/dL (ref 0.61–1.24)
Calcium: 8.9 mg/dL (ref 8.9–10.3)
Chloride: 100 mmol/L — ABNORMAL LOW (ref 101–111)
GFR calc Af Amer: >60 mL/min (ref >60)
GFR calc non Af Amer: >60 mL/min (ref >60)
GLUCOSE: 118 mg/dL — AB (ref 65–99)
POTASSIUM: 3.7 mmol/L (ref 3.5–5.1)
Sodium: 134 mmol/L — ABNORMAL LOW (ref 135–145)
TOTAL PROTEIN: 7.7 g/dL (ref 6.5–8.1)

## 2017-08-05 LAB — CBC
HEMATOCRIT: 41.7 % (ref 39.0–52.0)
Hemoglobin: 13.8 g/dL (ref 13.0–17.0)
MCH: 30.3 pg (ref 26.0–34.0)
MCHC: 33.1 g/dL (ref 30.0–36.0)
MCV: 91.6 fL (ref 78.0–100.0)
Platelets: 296 10*3/uL (ref 150–400)
RBC: 4.55 MIL/uL (ref 4.22–5.81)
RDW: 12.6 % (ref 11.5–15.5)
WBC: 21.4 10*3/uL — ABNORMAL HIGH (ref 4.0–10.5)

## 2017-08-05 LAB — LIPASE, BLOOD: LIPASE: 30 U/L (ref 11–51)

## 2017-08-05 NOTE — ED Triage Notes (Signed)
[facility] Patient is complaining of abd pain with nausea and vomiting today. Patient has not took any medications for symptoms.

## 2017-08-06 ENCOUNTER — Emergency Department (HOSPITAL_COMMUNITY): Payer: Medicare Other

## 2017-08-06 ENCOUNTER — Encounter (HOSPITAL_COMMUNITY): Payer: Self-pay | Admitting: Emergency Medicine

## 2017-08-06 DIAGNOSIS — R109 Unspecified abdominal pain: Secondary | ICD-10-CM | POA: Diagnosis not present

## 2017-08-06 DIAGNOSIS — R112 Nausea with vomiting, unspecified: Secondary | ICD-10-CM | POA: Diagnosis not present

## 2017-08-06 DIAGNOSIS — R45851 Suicidal ideations: Secondary | ICD-10-CM | POA: Diagnosis present

## 2017-08-06 DIAGNOSIS — R419 Unspecified symptoms and signs involving cognitive functions and awareness: Secondary | ICD-10-CM | POA: Diagnosis present

## 2017-08-06 DIAGNOSIS — F4310 Post-traumatic stress disorder, unspecified: Secondary | ICD-10-CM | POA: Diagnosis present

## 2017-08-06 DIAGNOSIS — Z62810 Personal history of physical and sexual abuse in childhood: Secondary | ICD-10-CM | POA: Diagnosis present

## 2017-08-06 DIAGNOSIS — F17210 Nicotine dependence, cigarettes, uncomplicated: Secondary | ICD-10-CM | POA: Diagnosis present

## 2017-08-06 DIAGNOSIS — F250 Schizoaffective disorder, bipolar type: Secondary | ICD-10-CM | POA: Diagnosis not present

## 2017-08-06 LAB — URINALYSIS, ROUTINE W REFLEX MICROSCOPIC
BILIRUBIN URINE: NEGATIVE
Bacteria, UA: NONE SEEN
GLUCOSE, UA: NEGATIVE mg/dL
Hgb urine dipstick: NEGATIVE
Ketones, ur: 5 mg/dL — AB
Leukocytes, UA: NEGATIVE
NITRITE: NEGATIVE
PH: 7 (ref 5.0–8.0)
Protein, ur: NEGATIVE mg/dL
SPECIFIC GRAVITY, URINE: 1.011 (ref 1.005–1.030)
Squamous Epithelial / LPF: NONE SEEN

## 2017-08-06 LAB — RAPID URINE DRUG SCREEN, HOSP PERFORMED
AMPHETAMINES: NOT DETECTED
BARBITURATES: NOT DETECTED
Benzodiazepines: NOT DETECTED
Cocaine: NOT DETECTED
OPIATES: NOT DETECTED
Tetrahydrocannabinol: POSITIVE — AB

## 2017-08-06 MED ORDER — HALOPERIDOL LACTATE 5 MG/ML IJ SOLN
2.0000 mg | Freq: Once | INTRAMUSCULAR | Status: AC
  Administered 2017-08-06: 2 mg via INTRAVENOUS
  Filled 2017-08-06: qty 1

## 2017-08-06 MED ORDER — ONDANSETRON HCL 4 MG/2ML IJ SOLN
4.0000 mg | Freq: Once | INTRAMUSCULAR | Status: DC
Start: 2017-08-06 — End: 2017-08-06
  Filled 2017-08-06: qty 2

## 2017-08-06 MED ORDER — SODIUM CHLORIDE 0.9 % IV BOLUS (SEPSIS)
1000.0000 mL | Freq: Once | INTRAVENOUS | Status: AC
  Administered 2017-08-06: 1000 mL via INTRAVENOUS

## 2017-08-06 MED ORDER — GI COCKTAIL ~~LOC~~
30.0000 mL | Freq: Once | ORAL | Status: AC
  Administered 2017-08-06: 30 mL via ORAL
  Filled 2017-08-06: qty 30

## 2017-08-06 MED ORDER — AZITHROMYCIN 250 MG PO TABS: ORAL_TABLET | ORAL | 0 refills | Status: DC

## 2017-08-06 MED ORDER — KETOROLAC TROMETHAMINE 30 MG/ML IJ SOLN
15.0000 mg | Freq: Once | INTRAMUSCULAR | Status: AC
  Administered 2017-08-06: 15 mg via INTRAVENOUS
  Filled 2017-08-06: qty 1

## 2017-08-06 MED ORDER — CAPSAICIN 0.025 % EX CREA
TOPICAL_CREAM | Freq: Two times a day (BID) | CUTANEOUS | Status: DC
  Administered 2017-08-06: 03:00:00 via TOPICAL
  Filled 2017-08-06: qty 60

## 2017-08-06 MED ORDER — DICYCLOMINE HCL 10 MG/ML IM SOLN
20.0000 mg | Freq: Once | INTRAMUSCULAR | Status: AC
  Administered 2017-08-06: 20 mg via INTRAMUSCULAR
  Filled 2017-08-06: qty 2

## 2017-08-06 NOTE — ED Provider Notes (Signed)
[facility]EMERGENCY DEPT Provider Note   CSN: [id number] Arrival date & time: 08/05/17  2003     History   Chief Complaint Chief Complaint  Patient presents with  . Abdominal Pain    HPI Gene Rivera is a 37 y.o. male.  The history is provided by the patient.  Abdominal Pain   This is a new problem. The current episode started 12 to 24 hours ago. The problem occurs constantly. The problem has not changed since onset.The pain is associated with an unknown factor. The pain is located in the periumbilical region (left flant). The quality of the pain is sharp. The pain is severe. Associated symptoms include diarrhea, nausea and vomiting. Pertinent negatives include fever. Nothing aggravates the symptoms. Nothing relieves the symptoms. Past workup does not include barium enema. His past medical history does not include PUD.    Past Medical History:  Diagnosis Date  . Bipolar affective disorder (HCC)     There are no active problems to display for this patient.   Past Surgical History:  Procedure Laterality Date  . LOBECTOMY Left 1990  . LUNG SURGERY         Home Medications    Prior to Admission medications   Medication Sig Start Date End Date Taking? Authorizing Provider  albuterol (PROVENTIL HFA;VENTOLIN HFA) 108 (90 Base) MCG/ACT inhaler Inhale 1-2 puffs into the lungs every 6 (six) hours as needed for wheezing or shortness of breath. Patient not taking: Reported on 08/06/2017 05/05/17   Coralyn MarkMitchell, Melanie L, NP  azithromycin (ZITHROMAX) 250 MG tablet Take 1 tablet (250 mg total) by mouth daily. Take first 2 tablets together, then 1 every day until finished. Patient not taking: Reported on 08/06/2017 05/05/17   Coralyn MarkMitchell, Melanie L, NP  benzonatate (TESSALON) 100 MG capsule Take 1 capsule (100 mg total) by mouth every 8 (eight) hours. Patient not taking: Reported on 08/06/2017 04/17/15   Teressa LowerPickering, Vrinda, NP  ibuprofen (ADVIL,MOTRIN) 800 MG tablet  Take 1 tablet (800 mg total) by mouth every 8 (eight) hours as needed for mild pain or moderate pain. Patient not taking: Reported on 08/06/2017 04/05/14   Trixie DredgeWest, Emily, PA-C  predniSONE (STERAPRED UNI-PAK 21 TAB) 10 MG (21) TBPK tablet Take by mouth daily. Take 6 tabs by mouth daily  for 2 days, then 5 tabs for 2 days, then 4 tabs for 2 days, then 3 tabs for 2 days, 2 tabs for 2 days, then 1 tab by mouth daily for 2 days Patient not taking: Reported on 08/06/2017 05/05/17   Coralyn MarkMitchell, Melanie L, NP  sodium chloride (OCEAN) 0.65 % SOLN nasal spray Place 1 spray into both nostrils as needed for congestion. Patient not taking: Reported on 08/06/2017 07/19/14   Rolla PlatePhelps, Erin O, PA-C    Family History History reviewed. No pertinent family history.  Social History Social History   Tobacco Use  . Smoking status: Current Every Day Smoker    Packs/day: 0.50  . Smokeless tobacco: Never Used  Substance Use Topics  . Alcohol use: Yes    Comment: Everyday. Last drink: 6 hrs ago  . Drug use: Yes    Types: Marijuana, Cocaine    Comment: Last used: A  few days ago      Allergies   Penicillins   Review of Systems Review of Systems  Constitutional: Negative for fever.  Eyes: Negative for photophobia.  Gastrointestinal: Positive for abdominal pain, diarrhea, nausea and vomiting. Negative for blood in stool.  All other  systems reviewed and are negative.    Physical Exam Updated Vital Signs BP 125/70 (BP Location: Left Arm)   Pulse (!) 52   Temp 99.1 F (37.3 C) (Oral)   Resp 18   Ht 5\' 9"  (1.753 m)   Wt 68 kg (150 lb)   SpO2 99%   BMI 22.15 kg/m   Physical Exam  Constitutional: He is oriented to person, place, and time. He appears well-developed and well-nourished. No distress.  HENT:  Head: Normocephalic and atraumatic.  Mouth/Throat: No oropharyngeal exudate.  Eyes: Conjunctivae are normal. Pupils are equal, round, and reactive to light.  Neck: Normal range of motion. Neck supple.    Cardiovascular: Normal rate, regular rhythm, normal heart sounds and intact distal pulses.  Pulmonary/Chest: Effort normal and breath sounds normal. No stridor. He has no wheezes. He has no rales.  Abdominal: Soft. He exhibits no mass. Bowel sounds are increased. There is no tenderness. There is no guarding.  Musculoskeletal: Normal range of motion.  Neurological: He is alert and oriented to person, place, and time. He displays normal reflexes.  Skin: Skin is warm and dry. Capillary refill takes less than 2 seconds.  Psychiatric: He has a normal mood and affect.     ED Treatments / Results  Labs (all labs ordered are listed, but only abnormal results are displayed)  Results for orders placed or performed during the hospital encounter of 08/05/17  Lipase, blood  Result Value Ref Range   Lipase 30 11 - 51 U/L  Comprehensive metabolic panel  Result Value Ref Range   Sodium 134 (L) 135 - 145 mmol/L   Potassium 3.7 3.5 - 5.1 mmol/L   Chloride 100 (L) 101 - 111 mmol/L   CO2 22 22 - 32 mmol/L   Glucose, Bld 118 (H) 65 - 99 mg/dL   BUN 9 6 - 20 mg/dL   Creatinine, Ser 1.61 0.61 - 1.24 mg/dL   Calcium 8.9 8.9 - 09.6 mg/dL   Total Protein 7.7 6.5 - 8.1 g/dL   Albumin 4.0 3.5 - 5.0 g/dL   AST 31 15 - 41 U/L   ALT 21 17 - 63 U/L   Alkaline Phosphatase 52 38 - 126 U/L   Total Bilirubin 1.6 (H) 0.3 - 1.2 mg/dL   GFR calc non Af Amer >60 >60 mL/min   GFR calc Af Amer >60 >60 mL/min   Anion gap 12 5 - 15  CBC  Result Value Ref Range   WBC 21.4 (H) 4.0 - 10.5 K/uL   RBC 4.55 4.22 - 5.81 MIL/uL   Hemoglobin 13.8 13.0 - 17.0 g/dL   HCT 04.5 40.9 - 81.1 %   MCV 91.6 78.0 - 100.0 fL   MCH 30.3 26.0 - 34.0 pg   MCHC 33.1 30.0 - 36.0 g/dL   RDW 91.4 78.2 - 95.6 %   Platelets 296 150 - 400 K/uL   No results found.  Procedures Procedures (including critical care time)  Medications Ordered in ED  Medications  ondansetron (ZOFRAN) injection 4 mg (4 mg Intravenous Not Given 08/06/17  0108)  capsaicin (ZOSTRIX) 0.025 % cream ( Topical Given 08/06/17 0317)  sodium chloride 0.9 % bolus 1,000 mL (1,000 mLs Intravenous New Bag/Given 08/06/17 0108)  dicyclomine (BENTYL) injection 20 mg (20 mg Intramuscular Given 08/06/17 0135)  gi cocktail (Maalox,Lidocaine,Donnatal) (30 mLs Oral Given 08/06/17 0129)  ketorolac (TORADOL) 30 MG/ML injection 15 mg (15 mg Intravenous Given 08/06/17 0130)  haloperidol lactate (HALDOL) injection 2 mg (2  mg Intravenous Given 08/06/17 0316)     Final Clinical Impressions(s) / ED Diagnoses  Vomiting and abdominal pain consistent with cannabis induced hyper emesis.  Given incidental finding on CT scan will treat for atypical PNA.  Follow up with your family doctor, stop using marijuana.    Return for worsening pain, vomiting blood inability to pass urine,  fevers > 100.4 unrelieved by medication, shortness of breath, intractable vomiting, or diarrhea, abdominal pain, Inability to tolerate liquids or food, cough, altered mental status or any concerns. No signs of systemic illness or infection. The patient is nontoxic-appearing on exam and vital signs are within normal limits.    I have reviewed the triage vital signs and the nursing notes. Pertinent labs &imaging results that were available during my care of the patient were reviewed by me and considered in my medical decision making (see chart for details).  After history, exam, and medical workup I feel the patient has been appropriately medically screened and is safe for discharge home. Pertinent diagnoses were discussed with the patient. Patient was given return precautions.      Ferron Ishmael, MD 08/06/17 [phone]

## 2017-09-09 ENCOUNTER — Emergency Department (HOSPITAL_COMMUNITY)
Admission: EM | Admit: 2017-09-09 | Discharge: 2017-09-09 | Disposition: A | Discharge status: Home / Self Care | Payer: Medicare Other | Attending: Emergency Medicine | Admitting: Emergency Medicine

## 2017-09-09 ENCOUNTER — Emergency Department (HOSPITAL_COMMUNITY): Payer: Medicare Other

## 2017-09-09 ENCOUNTER — Other Ambulatory Visit: Payer: Self-pay

## 2017-09-09 ENCOUNTER — Encounter (HOSPITAL_COMMUNITY): Payer: Self-pay | Admitting: Emergency Medicine

## 2017-09-09 DIAGNOSIS — F1210 Cannabis abuse, uncomplicated: Secondary | ICD-10-CM | POA: Insufficient documentation

## 2017-09-09 DIAGNOSIS — J40 Bronchitis, not specified as acute or chronic: Secondary | ICD-10-CM | POA: Insufficient documentation

## 2017-09-09 DIAGNOSIS — F17210 Nicotine dependence, cigarettes, uncomplicated: Secondary | ICD-10-CM | POA: Insufficient documentation

## 2017-09-09 DIAGNOSIS — R6883 Chills (without fever): Secondary | ICD-10-CM | POA: Insufficient documentation

## 2017-09-09 DIAGNOSIS — Z79899 Other long term (current) drug therapy: Secondary | ICD-10-CM | POA: Diagnosis not present

## 2017-09-09 DIAGNOSIS — R079 Chest pain, unspecified: Secondary | ICD-10-CM | POA: Diagnosis present

## 2017-09-09 DIAGNOSIS — R05 Cough: Secondary | ICD-10-CM | POA: Diagnosis not present

## 2017-09-09 DIAGNOSIS — R070 Pain in throat: Secondary | ICD-10-CM | POA: Insufficient documentation

## 2017-09-09 DIAGNOSIS — R0981 Nasal congestion: Secondary | ICD-10-CM | POA: Insufficient documentation

## 2017-09-09 DIAGNOSIS — F250 Schizoaffective disorder, bipolar type: Secondary | ICD-10-CM | POA: Diagnosis not present

## 2017-09-09 DIAGNOSIS — J3489 Other specified disorders of nose and nasal sinuses: Secondary | ICD-10-CM | POA: Diagnosis not present

## 2017-09-09 DIAGNOSIS — F1410 Cocaine abuse, uncomplicated: Secondary | ICD-10-CM | POA: Diagnosis not present

## 2017-09-09 DIAGNOSIS — R0789 Other chest pain: Secondary | ICD-10-CM | POA: Diagnosis not present

## 2017-09-09 LAB — BASIC METABOLIC PANEL
ANION GAP: 10 (ref 5–15)
BUN: 7 mg/dL (ref 6–20)
CALCIUM: 8.9 mg/dL (ref 8.9–10.3)
CO2: 26 mmol/L (ref 22–32)
Chloride: 102 mmol/L (ref 101–111)
Creatinine, Ser: 1.22 mg/dL (ref 0.61–1.24)
GFR calc Af Amer: >60 mL/min (ref >60)
GFR calc non Af Amer: >60 mL/min (ref >60)
GLUCOSE: 93 mg/dL (ref 65–99)
POTASSIUM: 3.8 mmol/L (ref 3.5–5.1)
Sodium: 138 mmol/L (ref 135–145)

## 2017-09-09 LAB — I-STAT TROPONIN, ED
Troponin i, poc: 0 ng/mL (ref 0.00–0.08)
Troponin i, poc: 0 ng/mL (ref 0.00–0.08)

## 2017-09-09 LAB — CBC
HEMATOCRIT: 39.2 % (ref 39.0–52.0)
HEMOGLOBIN: 12.4 g/dL — AB (ref 13.0–17.0)
MCH: 29.6 pg (ref 26.0–34.0)
MCHC: 31.6 g/dL (ref 30.0–36.0)
MCV: 93.6 fL (ref 78.0–100.0)
Platelets: 223 10*3/uL (ref 150–400)
RBC: 4.19 MIL/uL — ABNORMAL LOW (ref 4.22–5.81)
RDW: 12.8 % (ref 11.5–15.5)
WBC: 5.6 10*3/uL (ref 4.0–10.5)

## 2017-09-09 MED ORDER — DOXYCYCLINE HYCLATE 100 MG PO CAPS: 100.0000 mg | ORAL_CAPSULE | Freq: Two times a day (BID) | ORAL | 0 refills | Status: DC

## 2017-09-09 MED ORDER — NAPROXEN 500 MG PO TABS: 500.0000 mg | ORAL_TABLET | Freq: Two times a day (BID) | ORAL | 0 refills | Status: DC

## 2017-09-09 MED ORDER — IPRATROPIUM-ALBUTEROL 0.5-2.5 (3) MG/3ML IN SOLN
3.0000 mL | Freq: Once | RESPIRATORY_TRACT | Status: AC
  Administered 2017-09-09: 3 mL via RESPIRATORY_TRACT
  Filled 2017-09-09: qty 3

## 2017-09-09 MED ORDER — ALBUTEROL SULFATE HFA 108 (90 BASE) MCG/ACT IN AERS: 1.0000 | INHALATION_SPRAY | Freq: Four times a day (QID) | RESPIRATORY_TRACT | 0 refills | Status: DC | PRN

## 2017-09-09 NOTE — Discharge Instructions (Signed)
Stop smoking.  Follow-up with a primary doctor.  Return to the ED if you develop new or worsening symptoms.

## 2017-09-09 NOTE — ED Notes (Signed)
Pt.refused to dress in a gown .did allow me to put him on the monitor.ask could get the blood pt.refused to give blood drawn

## 2017-09-09 NOTE — ED Provider Notes (Signed)
[facility] EMERGENCY DEPARTMENT Provider Note   CSN: [id number] Arrival date & time: 09/09/17  0204     History   Chief Complaint Chief Complaint  Patient presents with  . Chest Pain    HPI Gene Rivera is a 37 y.o. male.  Patient presents with constant central chest pain for the past 2 days.  States he had a cough productive of green and yellow mucus.  He is also had congestion, rhinorrhea and sore throat.  Has had chills but no documented fever.  Patient is a smoker but denies any other drug use.  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  He uses albuterol on an as-needed basis continues to smoke cigarettes.  No vomiting or diarrhea.  No abdominal pain.  No sick contacts.   The history is provided by the patient.  Chest Pain   Associated symptoms include cough. Pertinent negatives include no abdominal pain, no dizziness, no fever, no headaches, no nausea, no vomiting and no weakness.    Past Medical History:  Diagnosis Date  . Bipolar affective disorder (HCC)     There are no active problems to display for this patient.   Past Surgical History:  Procedure Laterality Date  . LOBECTOMY Left 1990  . LUNG SURGERY         Home Medications    Prior to Admission medications   Medication Sig Start Date End Date Taking? Authorizing Provider  albuterol (PROVENTIL HFA;VENTOLIN HFA) 108 (90 Base) MCG/ACT inhaler Inhale 1-2 puffs into the lungs every 6 (six) hours as needed for wheezing or shortness of breath. Patient not taking: Reported on 08/06/2017 05/05/17   Coralyn Mark, NP  azithromycin (ZITHROMAX Z-PAK) 250 MG tablet 2 po day one, then 1 daily x 4 days 08/06/17   Nicanor Alcon, April, MD  azithromycin (ZITHROMAX) 250 MG tablet Take 1 tablet (250 mg total) by mouth daily. Take first 2 tablets together, then 1 every day until finished. Patient not  taking: Reported on 08/06/2017 05/05/17   Coralyn Mark, NP  benzonatate (TESSALON) 100 MG capsule Take 1 capsule (100 mg total) by mouth every 8 (eight) hours. Patient not taking: Reported on 08/06/2017 04/17/15   Teressa Lower, NP  ibuprofen (ADVIL,MOTRIN) 800 MG tablet Take 1 tablet (800 mg total) by mouth every 8 (eight) hours as needed for mild pain or moderate pain. Patient not taking: Reported on 08/06/2017 04/05/14   Trixie Dredge, PA-C  PARoxetine (PAXIL) 20 MG tablet Take 20 mg by mouth daily.    [provider]  predniSONE (STERAPRED UNI-PAK 21 TAB) 10 MG (21) TBPK tablet Take by mouth daily. Take 6 tabs by mouth daily  for 2 days, then 5 tabs for 2 days, then 4 tabs for 2 days, then 3 tabs for 2 days, 2 tabs for 2 days, then 1 tab by mouth daily for 2 days Patient not taking: Reported on 08/06/2017 05/05/17   Coralyn Mark, NP  sodium chloride (OCEAN) 0.65 % SOLN nasal spray Place 1 spray into both nostrils as needed for congestion. Patient not taking: Reported on 08/06/2017 07/19/14   Lurene Shadow, PA-C  traZODone (DESYREL) 50 MG tablet Take 50 mg by mouth at bedtime as needed for sleep.    [provider]  omeprazole (PRILOSEC) 20 MG capsule Take 1 capsule (20 mg total) by mouth daily.  Patient not taking: Reported on 04/17/2015 03/17/14 12/12/15  Muthersbaugh, Dahlia Client, PA-C    Family History No family history on file.  Social History Social History   Tobacco Use  . Smoking status: Current Every Day Smoker    Packs/day: 0.50  . Smokeless tobacco: Never Used  Substance Use Topics  . Alcohol use: Yes    Comment: Everyday. Last drink: 6 hrs ago  . Drug use: Yes    Types: Marijuana, Cocaine    Comment: Last used: A  few days ago      Allergies   Penicillins   Review of Systems Review of Systems  Constitutional: Negative for activity change, appetite change and fever.  HENT: Positive for congestion, rhinorrhea and sore throat.   Respiratory:  Positive for cough.   Cardiovascular: Positive for chest pain.  Gastrointestinal: Negative for abdominal pain, nausea and vomiting.  Genitourinary: Negative for dysuria and hematuria.  Musculoskeletal: Negative for arthralgias and myalgias.  Skin: Negative for rash.  Neurological: Negative for dizziness, weakness and headaches.   all other systems are negative except as noted in the HPI and PMH.     Physical Exam Updated Vital Signs BP 121/83 (BP Location: Right Arm)   Pulse 62   Temp 98.4 F (36.9 C) (Oral)   Resp 18   Ht 5\' 9"  (1.753 m)   Wt 72.6 kg (160 lb)   SpO2 100%   BMI 23.63 kg/m   Physical Exam  Constitutional: He is oriented to person, place, and time. He appears well-developed and well-nourished. No distress.  HENT:  Head: Normocephalic and atraumatic.  Mouth/Throat: Oropharynx is clear and moist. No oropharyngeal exudate.  Eyes: Conjunctivae and EOM are normal. Pupils are equal, round, and reactive to light.  Neck: Normal range of motion. Neck supple.  No meningismus.  Cardiovascular: Normal rate, regular rhythm, normal heart sounds and intact distal pulses.  No murmur heard. Pulmonary/Chest: Effort normal and breath sounds normal. No respiratory distress. He exhibits tenderness.  Coarse rhonchi bilaterally  Reproducible central chest tenderness and left rib tenderness  Abdominal: Soft. There is no tenderness. There is no rebound and no guarding.  Musculoskeletal: Normal range of motion. He exhibits no edema or tenderness.  Neurological: He is alert and oriented to person, place, and time. No cranial nerve deficit. He exhibits normal muscle tone. Coordination normal.  No ataxia on finger to nose bilaterally. No pronator drift. 5/5 strength throughout. CN 2-12 intact.Equal grip strength. Sensation intact.   Skin: Skin is warm.  Psychiatric: He has a normal mood and affect. His behavior is normal.  Nursing note and vitals reviewed.    ED Treatments / Results   Labs (all labs ordered are listed, but only abnormal results are displayed) Labs Reviewed  CBC - Abnormal; Notable for the following components:      Result Value   RBC 4.19 (*)    Hemoglobin 12.4 (*)    All other components within normal limits  BASIC METABOLIC PANEL  I-STAT TROPONIN, ED  I-STAT TROPONIN, ED    EKG  EKG Interpretation  Date/Time:  Wednesday September 09 2017 02:10:25 EST Ventricular Rate:  67 PR Interval:  172 QRS Duration: 88 QT Interval:  370 QTC Calculation: 390 R Axis:   83 Text Interpretation:  Normal sinus rhythm with sinus arrhythmia Early repolarization Normal ECG No significant change was found Confirmed by Glynn Octave [phone]) on 09/09/2017 5:48:30 AM       Radiology Dg Chest 2 View  Result Date: 09/09/2017  CLINICAL DATA:  Chest pain, cough EXAM: CHEST  2 VIEW COMPARISON:  04/17/2015 FINDINGS: Heart and mediastinal contours are within normal limits. No focal opacities or effusions. No acute bony abnormality. IMPRESSION: No active cardiopulmonary disease. Electronically Signed   By: Charlett NoseKevin  Dover M.D.   On: 09/09/2017 02:49    Procedures Procedures (including critical care time)  Medications Ordered in ED Medications  ipratropium-albuterol (DUONEB) 0.5-2.5 (3) MG/3ML nebulizer solution 3 mL (not administered)     Initial Impression / Assessment and Plan / ED Course  I have reviewed the triage vital signs and the nursing notes.  Pertinent labs & imaging results that were available during my care of the patient were reviewed by me and considered in my medical decision making (see chart for details).     Patient with 2 days of chest pain with productive cough.  Reproducible chest pain on exam.  Rhonchi on exam. will be given breathing treatment.  X-rays negative for pneumonia.  Low suspicion for ACS or pulmonary embolism.  EKG is unchanged diffuse ST elevation consistent with early re-polarization.  Troponin negative x2.  Low suspicion  for ACS or PE.   Discussed with patient we will treat for bronchitis in setting of smoking.  Needs to stop smoking.  Will give bronchodilators, antibiotics and cough suppressants.  Follow-up with PCP.  Return precautions discussed  Final Clinical Impressions(s) / ED Diagnoses   Final diagnoses:  Atypical chest pain  Bronchitis    ED Discharge Orders    None       Hollister Wessler, Jeannett SeniorStephen, MD 09/09/17 [phone]

## 2017-09-09 NOTE — ED Triage Notes (Signed)
Pt reports CP since Friday, central, non radiating, 8/10. Pt also reports cold like S/S, cough nasal congestion.

## 2017-09-30 DIAGNOSIS — R079 Chest pain, unspecified: Secondary | ICD-10-CM | POA: Diagnosis not present

## 2017-12-04 ENCOUNTER — Emergency Department (HOSPITAL_COMMUNITY)
Admission: EM | Admit: 2017-12-04 | Discharge: 2017-12-04 | Disposition: A | Discharge status: Home / Self Care | Payer: Medicare Other | Attending: Emergency Medicine | Admitting: Emergency Medicine

## 2017-12-04 ENCOUNTER — Emergency Department (HOSPITAL_COMMUNITY): Payer: Medicare Other

## 2017-12-04 ENCOUNTER — Encounter (HOSPITAL_COMMUNITY): Payer: Self-pay

## 2017-12-04 ENCOUNTER — Other Ambulatory Visit: Payer: Self-pay

## 2017-12-04 DIAGNOSIS — Z79899 Other long term (current) drug therapy: Secondary | ICD-10-CM | POA: Insufficient documentation

## 2017-12-04 DIAGNOSIS — J40 Bronchitis, not specified as acute or chronic: Secondary | ICD-10-CM

## 2017-12-04 DIAGNOSIS — R079 Chest pain, unspecified: Secondary | ICD-10-CM | POA: Diagnosis not present

## 2017-12-04 DIAGNOSIS — R05 Cough: Secondary | ICD-10-CM | POA: Diagnosis not present

## 2017-12-04 DIAGNOSIS — F17210 Nicotine dependence, cigarettes, uncomplicated: Secondary | ICD-10-CM | POA: Diagnosis not present

## 2017-12-04 DIAGNOSIS — R509 Fever, unspecified: Secondary | ICD-10-CM | POA: Diagnosis not present

## 2017-12-04 DIAGNOSIS — J209 Acute bronchitis, unspecified: Secondary | ICD-10-CM | POA: Diagnosis not present

## 2017-12-04 LAB — I-STAT CHEM 8, ED
BUN: 6 mg/dL (ref 6–20)
CALCIUM ION: 1.14 mmol/L — AB (ref 1.15–1.40)
CHLORIDE: 103 mmol/L (ref 101–111)
CREATININE: 1.2 mg/dL (ref 0.61–1.24)
GLUCOSE: 75 mg/dL (ref 65–99)
HEMATOCRIT: 44 % (ref 39.0–52.0)
Hemoglobin: 15 g/dL (ref 13.0–17.0)
POTASSIUM: 3.9 mmol/L (ref 3.5–5.1)
Sodium: 143 mmol/L (ref 135–145)
TCO2: 27 mmol/L (ref 22–32)

## 2017-12-04 LAB — I-STAT TROPONIN, ED: TROPONIN I, POC: 0 ng/mL (ref 0.00–0.08)

## 2017-12-04 MED ORDER — OPTICHAMBER DIAMOND MISC
Freq: Once | Status: AC
  Administered 2017-12-04: 10:00:00
  Filled 2017-12-04: qty 1

## 2017-12-04 MED ORDER — ACETAMINOPHEN 325 MG PO TABS
650.0000 mg | ORAL_TABLET | Freq: Once | ORAL | Status: AC
  Administered 2017-12-04: 650 mg via ORAL
  Filled 2017-12-04: qty 2

## 2017-12-04 MED ORDER — ALBUTEROL SULFATE HFA 108 (90 BASE) MCG/ACT IN AERS
2.0000 | INHALATION_SPRAY | RESPIRATORY_TRACT | Status: DC | PRN
  Administered 2017-12-04: 2 via RESPIRATORY_TRACT
  Filled 2017-12-04: qty 6.7

## 2017-12-04 NOTE — ED Triage Notes (Signed)
Pt states sharp left side chest pain that started last night. States some associated SOB. Pt denies cardiac hx. No distress noted in triage, skin warm and dry.

## 2017-12-04 NOTE — Discharge Instructions (Addendum)
Take Tylenol as directed for pain every 4 hours as needed. Use your albuterol inhaler 2 puffs every 4 hours as needed for cough. Call the number on these instructions to get a primary care physician. Ask your primary care physician to help you to stop smoking

## 2017-12-04 NOTE — ED Notes (Signed)
Lab results given to Dr. J. 

## 2017-12-04 NOTE — ED Provider Notes (Signed)
[facility] EMERGENCY DEPARTMENT Provider Note   CSN: [id number] Arrival date & time: 12/04/17  0720     History   Chief Complaint Chief Complaint  Patient presents with  . Chest Pain    HPI Gene Rivera is a 37 y.o. male.  HPIcomplains of chest pain anterior onset 7 PM yesterday constant, pain is worse with changing positions or with coughing improved with remaining still. He's been coughing up yellow material since yesterday. He denies any shortness of breath, associated symptoms include subjective fever. No nausea no sweatiness. No other associated symptoms. No treatment prior to coming here  Past Medical History:  Diagnosis Date  . Bipolar affective disorder (HCC)    pneumonia There are no active problems to display for this patient.   Past Surgical History:  Procedure Laterality Date  . LOBECTOMY Left 1990  . LUNG SURGERY          Home Medications    Prior to Admission medications   Medication Sig Start Date End Date Taking? Authorizing Provider  albuterol (PROVENTIL HFA;VENTOLIN HFA) 108 (90 Base) MCG/ACT inhaler Inhale 1-2 puffs into the lungs every 6 (six) hours as needed for wheezing or shortness of breath. 09/09/17   Rancour, Jeannett Senior, MD  azithromycin (ZITHROMAX Z-PAK) 250 MG tablet 2 po day one, then 1 daily x 4 days 08/06/17   Palumbo, April, MD  azithromycin (ZITHROMAX) 250 MG tablet Take 1 tablet (250 mg total) by mouth daily. Take first 2 tablets together, then 1 every day until finished. Patient not taking: Reported on 08/06/2017 05/05/17   Coralyn Mark, NP  benzonatate (TESSALON) 100 MG capsule Take 1 capsule (100 mg total) by mouth every 8 (eight) hours. Patient not taking: Reported on 08/06/2017 04/17/15   Teressa Lower, NP  doxycycline (VIBRAMYCIN) 100 MG capsule Take 1 capsule (100 mg total) by mouth 2 (two) times daily. 09/09/17   Rancour, Jeannett Senior, MD  ibuprofen (ADVIL,MOTRIN) 800 MG tablet Take 1 tablet (800 mg total)  by mouth every 8 (eight) hours as needed for mild pain or moderate pain. Patient not taking: Reported on 08/06/2017 04/05/14   Trixie Dredge, PA-C  naproxen (NAPROSYN) 500 MG tablet Take 1 tablet (500 mg total) by mouth 2 (two) times daily. 09/09/17   Rancour, Jeannett Senior, MD  PARoxetine (PAXIL) 20 MG tablet Take 20 mg by mouth daily.    [provider]  predniSONE (STERAPRED UNI-PAK 21 TAB) 10 MG (21) TBPK tablet Take by mouth daily. Take 6 tabs by mouth daily  for 2 days, then 5 tabs for 2 days, then 4 tabs for 2 days, then 3 tabs for 2 days, 2 tabs for 2 days, then 1 tab by mouth daily for 2 days Patient not taking: Reported on 08/06/2017 05/05/17   Coralyn Mark, NP  sodium chloride (OCEAN) 0.65 % SOLN nasal spray Place 1 spray into both nostrils as needed for congestion. Patient not taking: Reported on 08/06/2017 07/19/14   Lurene Shadow, PA-C  traZODone (DESYREL) 50 MG tablet Take 50 mg by mouth at bedtime as needed for sleep.    [provider]  Patient reports only medication presently is trazodone  Family History History reviewed. No pertinent family history. Patient reports mother had MI in her 50s. Mother has a pacemaker Social History Social History   Tobacco Use  . Smoking status: Current Every Day Smoker    Packs/day: 0.50  . Smokeless tobacco: Never Used  Substance Use Topics  . Alcohol  use: Yes    Comment: Everyday. Last drink: 6 hrs ago  . Drug use: Yes    Types: Marijuana, Cocaine    Comment: Last used: A  few days ago   only medication currently is   Allergies   Penicillins   Review of Systems Review of Systems  Constitutional: Positive for fever.  HENT: Negative.   Respiratory: Positive for cough.   Cardiovascular: Positive for chest pain.  Gastrointestinal: Negative.   Musculoskeletal: Negative.   Skin: Negative.   Neurological: Negative.   Psychiatric/Behavioral: Negative.   All other systems reviewed and are negative.    Physical  Exam Updated Vital Signs BP 137/79 (BP Location: Left Arm)   Pulse 95   Temp 98.5 F (36.9 C) (Oral)   Resp 17   SpO2 100%   Physical Exam  Constitutional: He is oriented to person, place, and time. He appears well-developed and well-nourished.  HENT:  Head: Normocephalic and atraumatic.  Eyes: Pupils are equal, round, and reactive to light. Conjunctivae are normal.  Neck: Neck supple. No tracheal deviation present. No thyromegaly present.  Cardiovascular: Normal rate and regular rhythm.  No murmur heard. Pulmonary/Chest: Effort normal and breath sounds normal.  Occasional cough. Scant rhonchi diffusely. No respiratory distress  Abdominal: Soft. Bowel sounds are normal. He exhibits no distension. There is no tenderness.  Musculoskeletal: Normal range of motion. He exhibits no edema or tenderness.  Neurological: He is alert and oriented to person, place, and time. Coordination normal.  Skin: Skin is warm and dry. No rash noted.  Psychiatric: He has a normal mood and affect.  Nursing note and vitals reviewed.    ED Treatments / Results  Labs (all labs ordered are listed, but only abnormal results are displayed) Labs Reviewed  BASIC METABOLIC PANEL  CBC  I-STAT TROPONIN, ED    EKG EKG Interpretation  Date/Time:  Friday Dec 04 2017 07:27:58 EDT Ventricular Rate:  89 PR Interval:  170 QRS Duration: 84 QT Interval:  352 QTC Calculation: 428 R Axis:   91 Text Interpretation:  Normal sinus rhythm Rightward axis Borderline ECG No significant change since last tracing Confirmed by Doug Sou [phone]) on 12/04/2017 7:29:27 AM   Radiology No results found.  Procedures Procedures (including critical care time)  Medications Ordered in ED Medications - No data to display  Chest xray viewed by me Results for orders placed or performed during the hospital encounter of 12/04/17  I-stat troponin, ED  Result Value Ref Range   Troponin i, poc 0.00 0.00 - 0.08 ng/mL    Comment 3          I-stat chem 8, ed  Result Value Ref Range   Sodium 143 135 - 145 mmol/L   Potassium 3.9 3.5 - 5.1 mmol/L   Chloride 103 101 - 111 mmol/L   BUN 6 6 - 20 mg/dL   Creatinine, Ser 1.91 0.61 - 1.24 mg/dL   Glucose, Bld 75 65 - 99 mg/dL   Calcium, Ion 4.78 (L) 1.15 - 1.40 mmol/L   TCO2 27 22 - 32 mmol/L   Hemoglobin 15.0 13.0 - 17.0 g/dL   HCT 29.5 62.1 - 30.8 %   Dg Chest 2 View  Result Date: 12/04/2017 CLINICAL DATA:  Left-sided chest pain EXAM: CHEST - 2 VIEW COMPARISON:  09/30/2017 FINDINGS: Cardiac shadow is within normal limits. The lungs are well aerated bilaterally. No focal infiltrate or sizable effusion is seen. No acute bony abnormality is noted. IMPRESSION: No active cardiopulmonary disease.  Electronically Signed   By: Alcide Clever M.D.   On: 12/04/2017 07:58   Initial Impression / Assessment and Plan / ED Course  I have reviewed the triage vital signs and the nursing notes.  Pertinent labs & imaging results that were available during my care of the patient were reviewed by me and considered in my medical decision making (see chart for details).    9:35 AM sleeping, easily arousable. Pain improved after treatment with Tylenol. Plan albuterol HFA with spacer to go to use 2 puffs every 4 hours as needed for cough. Referral primary care  Heart score equals 2. Symptomsand exam consistent with bronchitis Counseled patient for 5 minutes on smoking cessation Final Clinical Impressions(s) / ED Diagnoses  Diagnosis #1 acute bronchitis #2 tobacco abuse Final diagnoses:  None    ED Discharge Orders    None       Doug Sou, MD 12/04/17 [phone]

## 2017-12-04 NOTE — ED Notes (Signed)
Patient transported to X-ray 

## 2017-12-28 DIAGNOSIS — Z5181 Encounter for therapeutic drug level monitoring: Secondary | ICD-10-CM | POA: Diagnosis not present

## 2018-01-05 ENCOUNTER — Encounter (HOSPITAL_COMMUNITY): Payer: Self-pay

## 2018-01-05 ENCOUNTER — Other Ambulatory Visit: Payer: Self-pay

## 2018-01-05 ENCOUNTER — Emergency Department (HOSPITAL_COMMUNITY)
Admission: EM | Admit: 2018-01-05 | Discharge: 2018-01-05 | Disposition: A | Discharge status: Home / Self Care | Payer: Medicare Other | Attending: Emergency Medicine | Admitting: Emergency Medicine

## 2018-01-05 DIAGNOSIS — S39012A Strain of muscle, fascia and tendon of lower back, initial encounter: Secondary | ICD-10-CM | POA: Diagnosis not present

## 2018-01-05 DIAGNOSIS — Y929 Unspecified place or not applicable: Secondary | ICD-10-CM | POA: Diagnosis not present

## 2018-01-05 DIAGNOSIS — Z79899 Other long term (current) drug therapy: Secondary | ICD-10-CM | POA: Insufficient documentation

## 2018-01-05 DIAGNOSIS — Y9389 Activity, other specified: Secondary | ICD-10-CM | POA: Diagnosis not present

## 2018-01-05 DIAGNOSIS — X500XXA Overexertion from strenuous movement or load, initial encounter: Secondary | ICD-10-CM | POA: Insufficient documentation

## 2018-01-05 DIAGNOSIS — S3992XA Unspecified injury of lower back, initial encounter: Secondary | ICD-10-CM | POA: Diagnosis present

## 2018-01-05 DIAGNOSIS — M5489 Other dorsalgia: Secondary | ICD-10-CM | POA: Diagnosis not present

## 2018-01-05 DIAGNOSIS — Y999 Unspecified external cause status: Secondary | ICD-10-CM | POA: Insufficient documentation

## 2018-01-05 DIAGNOSIS — R52 Pain, unspecified: Secondary | ICD-10-CM | POA: Diagnosis not present

## 2018-01-05 DIAGNOSIS — F17200 Nicotine dependence, unspecified, uncomplicated: Secondary | ICD-10-CM | POA: Insufficient documentation

## 2018-01-05 DIAGNOSIS — K047 Periapical abscess without sinus: Secondary | ICD-10-CM

## 2018-01-05 DIAGNOSIS — R609 Edema, unspecified: Secondary | ICD-10-CM | POA: Diagnosis not present

## 2018-01-05 DIAGNOSIS — I10 Essential (primary) hypertension: Secondary | ICD-10-CM | POA: Diagnosis not present

## 2018-01-05 MED ORDER — CLINDAMYCIN HCL 150 MG PO CAPS: 450.0000 mg | ORAL_CAPSULE | Freq: Three times a day (TID) | ORAL | 0 refills | Status: AC

## 2018-01-05 MED ORDER — IBUPROFEN 800 MG PO TABS: 800.0000 mg | ORAL_TABLET | Freq: Three times a day (TID) | ORAL | 0 refills | Status: DC

## 2018-01-05 MED ORDER — KETOROLAC TROMETHAMINE 30 MG/ML IJ SOLN
30.0000 mg | Freq: Once | INTRAMUSCULAR | Status: AC
  Administered 2018-01-05: 30 mg via INTRAVENOUS
  Filled 2018-01-05: qty 1

## 2018-01-05 MED ORDER — CLINDAMYCIN PHOSPHATE 600 MG/50ML IV SOLN
600.0000 mg | Freq: Once | INTRAVENOUS | Status: AC
  Administered 2018-01-05: 600 mg via INTRAVENOUS
  Filled 2018-01-05: qty 50

## 2018-01-05 MED ORDER — CHLORHEXIDINE GLUCONATE 0.12 % MT SOLN: OROMUCOSAL | 0 refills | Status: DC

## 2018-01-05 NOTE — ED Triage Notes (Signed)
Per EMS- Patient c/o left dental pain and slight facial swelling and back pain x 3 days.

## 2018-01-05 NOTE — ED Provider Notes (Signed)
[facility]-EMERGENCY DEPT Provider Note   CSN: [id number] Arrival date & time: 01/05/18  0913     History   Chief Complaint Chief Complaint  Patient presents with  . Dental Pain  . Back Pain    HPI Gene Rivera is a 37 y.o. male.  37 year old male with past medical history including bipolar disorder who presents with left dental pain.  He has had pain in his left upper molar for several days and has developed associated left facial swelling.  Currently his pain is severe.  He took Aleve last night with no relief.  He has had no associated fevers. He has been waiting 5 months for a dentist appointment, he has one scheduled for the end of July.    He also notes a few days of low back pain.  This began after he helped move furniture.  He denies any leg weakness/numbness, problems walking, or bowel/bladder incontinence.  The history is provided by the patient.  Dental Pain    Back Pain   Pertinent negatives include no fever.    Past Medical History:  Diagnosis Date  . Bipolar affective disorder (HCC)     There are no active problems to display for this patient.   Past Surgical History:  Procedure Laterality Date  . LOBECTOMY Left 1990  . LUNG SURGERY          Home Medications    Prior to Admission medications   Medication Sig Start Date End Date Taking? Authorizing Provider  albuterol (PROVENTIL HFA;VENTOLIN HFA) 108 (90 Base) MCG/ACT inhaler Inhale 1-2 puffs into the lungs every 6 (six) hours as needed for wheezing or shortness of breath. 09/09/17  Yes Rancour, Jeannett SeniorStephen, MD  chlorproMAZINE HCl (THORAZINE PO) Take 1 tablet by mouth daily.   Yes [provider]  PARoxetine (PAXIL) 20 MG tablet Take 20 mg by mouth daily.   Yes [provider]  traZODone (DESYREL) 50 MG tablet Take 50 mg by mouth at bedtime as needed for sleep.   Yes [provider]  clindamycin (CLEOCIN) 150 MG capsule Take 3 capsules (450 mg total)  by mouth 3 (three) times daily for 7 days. 01/05/18 01/12/18  Hennessey Cantrell, Ambrose Finlandachel Morgan, MD  ibuprofen (ADVIL,MOTRIN) 800 MG tablet Take 1 tablet (800 mg total) by mouth 3 (three) times daily. 01/05/18   Tereso Unangst, Ambrose Finlandachel Morgan, MD    Family History Family History  Problem Relation Age of Onset  . Diabetes Mother   . Hypertension Mother     Social History Social History   Tobacco Use  . Smoking status: Current Every Day Smoker    Packs/day: 0.50  . Smokeless tobacco: Never Used  Substance Use Topics  . Alcohol use: Yes    Comment: Everyday. Last drink: 6 hrs ago  . Drug use: Yes    Types: Marijuana, Cocaine     Allergies   Penicillins   Review of Systems Review of Systems  Constitutional: Negative for fever.  HENT: Positive for dental problem and facial swelling. Negative for trouble swallowing.   Musculoskeletal: Positive for back pain.  All other systems reviewed and are negative.    Physical Exam Updated Vital Signs BP 132/89 (BP Location: Left Arm)   Pulse (!) 49   Temp 99.1 F (37.3 C) (Oral)   Resp 16   Ht 5\' 9"  (1.753 m)   Wt 68 kg (150 lb)   SpO2 100%   BMI 22.15 kg/m   Physical Exam  Constitutional: He  is oriented to person, place, and time. He appears well-developed and well-nourished. No distress.  HENT:  Head: Normocephalic and atraumatic.  Nose: Nose normal.  Mouth/Throat: Oropharynx is clear and moist.  Poor dentition with multiple dental caries; tooth #15 broken to gumline, no appreciable fluctuant swelling of mucosa; mild edema and tenderness of L cheek  Eyes: Conjunctivae are normal.  Neck: Neck supple. No tracheal deviation present.  Pulmonary/Chest: Effort normal.  Neurological: He is alert and oriented to person, place, and time. He displays normal reflexes. No sensory deficit.  Normal strength BLE  Skin: Skin is warm and dry.  Psychiatric: He has a normal mood and affect. Judgment normal.  Nursing note and vitals reviewed.    ED  Treatments / Results  Labs (all labs ordered are listed, but only abnormal results are displayed) Labs Reviewed - No data to display  EKG None  Radiology No results found.  Procedures Procedures (including critical care time)  Medications Ordered in ED Medications  clindamycin (CLEOCIN) IVPB 600 mg (600 mg Intravenous New Bag/Given 01/05/18 1145)  ketorolac (TORADOL) 30 MG/ML injection 30 mg (30 mg Intravenous Given 01/05/18 1145)     Initial Impression / Assessment and Plan / ED Course  I have reviewed the triage vital signs and the nursing notes.      No obvious drainable fluid collection on oral exam. I suspect dental abscess. He is protecting airway, breathing normally, no problems swallowing. Gave IV clinda and outpatient clinda course w/ dental resource guide to obtain next available appointment. Gave toradol for pain. Extensively reviewed return precautions including any signs of worsening infection and he voiced understanding.  Final Clinical Impressions(s) / ED Diagnoses   Final diagnoses:  Dental abscess  Strain of lumbar region, initial encounter    ED Discharge Orders        Ordered    clindamycin (CLEOCIN) 150 MG capsule  3 times daily     01/05/18 1155    ibuprofen (ADVIL,MOTRIN) 800 MG tablet  3 times daily     01/05/18 1155       Chris Narasimhan, Ambrose Finland, MD 01/05/18 1204

## 2018-01-05 NOTE — Discharge Instructions (Signed)
Take antibiotics until finished and follow up with dentist as soon as possible. Return to ER if you have fevers, worsening swelling, breathing or swallowing problems.

## 2018-01-06 ENCOUNTER — Emergency Department (HOSPITAL_COMMUNITY)
Admission: EM | Admit: 2018-01-06 | Discharge: 2018-01-06 | Disposition: A | Discharge status: Home / Self Care | Payer: Medicare Other | Attending: Emergency Medicine | Admitting: Emergency Medicine

## 2018-01-06 ENCOUNTER — Encounter (HOSPITAL_COMMUNITY): Payer: Self-pay | Admitting: Emergency Medicine

## 2018-01-06 DIAGNOSIS — K029 Dental caries, unspecified: Secondary | ICD-10-CM | POA: Diagnosis not present

## 2018-01-06 DIAGNOSIS — Z79899 Other long term (current) drug therapy: Secondary | ICD-10-CM | POA: Diagnosis not present

## 2018-01-06 DIAGNOSIS — F17200 Nicotine dependence, unspecified, uncomplicated: Secondary | ICD-10-CM | POA: Diagnosis not present

## 2018-01-06 DIAGNOSIS — K0889 Other specified disorders of teeth and supporting structures: Secondary | ICD-10-CM | POA: Diagnosis not present

## 2018-01-06 DIAGNOSIS — R609 Edema, unspecified: Secondary | ICD-10-CM | POA: Diagnosis not present

## 2018-01-06 MED ORDER — DEXAMETHASONE SODIUM PHOSPHATE 10 MG/ML IJ SOLN
10.0000 mg | Freq: Once | INTRAMUSCULAR | Status: AC
  Administered 2018-01-06: 10 mg via INTRAMUSCULAR
  Filled 2018-01-06: qty 1

## 2018-01-06 MED ORDER — ACETAMINOPHEN 500 MG PO TABS
1000.0000 mg | ORAL_TABLET | Freq: Once | ORAL | Status: AC
  Administered 2018-01-06: 1000 mg via ORAL
  Filled 2018-01-06: qty 2

## 2018-01-06 MED ORDER — LIDOCAINE VISCOUS HCL 2 % MT SOLN
15.0000 mL | Freq: Once | OROMUCOSAL | Status: AC
  Administered 2018-01-06: 15 mL via OROMUCOSAL
  Filled 2018-01-06: qty 15

## 2018-01-06 MED ORDER — CLINDAMYCIN HCL 300 MG PO CAPS
300.0000 mg | ORAL_CAPSULE | Freq: Once | ORAL | Status: AC
  Administered 2018-01-06: 300 mg via ORAL
  Filled 2018-01-06: qty 1

## 2018-01-06 MED ORDER — NAPROXEN 500 MG PO TABS
500.0000 mg | ORAL_TABLET | Freq: Once | ORAL | Status: AC
  Administered 2018-01-06: 500 mg via ORAL
  Filled 2018-01-06: qty 1

## 2018-01-06 NOTE — ED Provider Notes (Signed)
[facility]-EMERGENCY DEPT Provider Note   CSN: [id number] Arrival date & time: 01/06/18  0123     History   Chief Complaint Chief Complaint  Patient presents with  . Dental Pain    HPI Gene Rivera is a 37 y.o. male.  The history is provided by the patient.  Dental Pain   This is a new problem. The current episode started more than 1 week ago. The problem occurs constantly. The problem has been gradually worsening. The pain is at a severity of 10/10. The pain is severe. He has tried nothing for the symptoms. The treatment provided no relief.  States he was seen earlier in the day and cheek is still swollen and he wants something stronger for pain.  He wants us to pull his tooth as he does not have an appointment.  No f/c/r.    Past Medical History:  Diagnosis Date  . Bipolar affective disorder (HCC)     There are no active problems to display for this patient.   Past Surgical History:  Procedure Laterality Date  . LOBECTOMY Left 1990  . LUNG SURGERY          Home Medications    Prior to Admission medications   Medication Sig Start Date End Date Taking? Authorizing Provider  albuterol (PROVENTIL HFA;VENTOLIN HFA) 108 (90 Base) MCG/ACT inhaler Inhale 1-2 puffs into the lungs every 6 (six) hours as needed for wheezing or shortness of breath. 09/09/17   Rancour, Jeannett SeniorStephen, MD  chlorhexidine (PERIDEX) 0.12 % solution Swish and spit out 15ml in mouth twice daily for 1-2 weeks. 01/05/18   Little, Ambrose Finlandachel Morgan, MD  chlorproMAZINE HCl (THORAZINE PO) Take 1 tablet by mouth daily.    [provider]  clindamycin (CLEOCIN) 150 MG capsule Take 3 capsules (450 mg total) by mouth 3 (three) times daily for 7 days. 01/05/18 01/12/18  Little, Ambrose Finlandachel Morgan, MD  ibuprofen (ADVIL,MOTRIN) 800 MG tablet Take 1 tablet (800 mg total) by mouth 3 (three) times daily. 01/05/18   Little, Ambrose Finlandachel Morgan, MD  PARoxetine (PAXIL) 20 MG tablet Take 20 mg by mouth daily.     [provider]  traZODone (DESYREL) 50 MG tablet Take 50 mg by mouth at bedtime as needed for sleep.    [provider]    Family History Family History  Problem Relation Age of Onset  . Diabetes Mother   . Hypertension Mother     Social History Social History   Tobacco Use  . Smoking status: Current Every Day Smoker    Packs/day: 0.50  . Smokeless tobacco: Never Used  Substance Use Topics  . Alcohol use: Yes    Comment: Everyday. Last drink: 6 hrs ago  . Drug use: Yes    Types: Marijuana, Cocaine     Allergies   Penicillins   Review of Systems Review of Systems  Constitutional: Negative for fever.  HENT: Positive for dental problem. Negative for drooling, sore throat and trouble swallowing.   Respiratory: Negative for shortness of breath.   Cardiovascular: Negative for chest pain, palpitations and leg swelling.  All other systems reviewed and are negative.    Physical Exam Updated Vital Signs There were no vitals taken for this visit.  Physical Exam  Constitutional: He is oriented to person, place, and time. He appears well-developed and well-nourished. No distress.  HENT:  Head: Normocephalic and atraumatic. Head is without right periorbital erythema and without left periorbital erythema.    Nose: Nose  normal.  Mouth/Throat: No oral lesions. No trismus in the jaw. Abnormal dentition. Dental caries present. No uvula swelling. No oropharyngeal exudate, posterior oropharyngeal edema or posterior oropharyngeal erythema.  Tooth 15 is broken at the gumline no drainible abscess.  Intact phonation, no trismus no neck swelling  Eyes: Pupils are equal, round, and reactive to light. Conjunctivae are normal.  Neck: Normal range of motion. Neck supple.  Cardiovascular: Normal rate, regular rhythm, normal heart sounds and intact distal pulses.  Pulmonary/Chest: Effort normal and breath sounds normal. No stridor. He has no wheezes. He has no rales.    Abdominal: Soft. Bowel sounds are normal. He exhibits no mass. There is no tenderness. There is no guarding.  Musculoskeletal: Normal range of motion.  Lymphadenopathy:    He has no cervical adenopathy.  Neurological: He is alert and oriented to person, place, and time. He displays normal reflexes.  Skin: Skin is warm and dry. Capillary refill takes less than 2 seconds.  Psychiatric: He has a normal mood and affect.  Nursing note and vitals reviewed.    ED Treatments / Results  Labs (all labs ordered are listed, but only abnormal results are displayed) Labs Reviewed - No data to display  EKG None  Radiology No results found.  Procedures Procedures (including critical care time)  Medications Ordered in ED Medications  clindamycin (CLEOCIN) capsule 300 mg (has no administration in time range)  naproxen (NAPROSYN) tablet 500 mg (has no administration in time range)  acetaminophen (TYLENOL) tablet 1,000 mg (has no administration in time range)  lidocaine (XYLOCAINE) 2 % viscous mouth solution 15 mL (has no administration in time range)  dexamethasone (DECADRON) injection 10 mg (has no administration in time range)      Final Clinical Impressions(s) / ED Diagnoses   EDP explained politely that we do not remove teeth in the ED and based on policy we will not be discharging on narcotics.  Patient has been seen for this in the past and has never followed up.  I have stated he needs an appointment ASAP with a dentist as this pain will not completely improve until the affected tooth is extracted.  The patient is advised he MUST follow up and take all antibiotics which he has not yet started.    Return for weakness, numbness, changes in vision or speech, fevers >100.4 unrelieved by medication, shortness of breath, intractable vomiting, or diarrhea, abdominal pain, Inability to tolerate liquids or food, cough, altered mental status or any concerns. No signs of systemic illness or  infection. The patient is nontoxic-appearing on exam and vital signs are within normal limits. Will refer to urology for microscopy hematuria as patient is asymptomatic.  Patient verbalizes understanding and agrees to follow up.    I have reviewed the triage vital signs and the nursing notes. Pertinent labs &imaging results that were available during my care of the patient were reviewed by me and considered in my medical decision making (see chart for details).  After history, exam, and medical workup I feel the patient has been appropriately medically screened and is safe for discharge home. Pertinent diagnoses were discussed with the patient. Patient was given return precautions.    Kingston Guiles, MD 01/06/18 0140

## 2018-01-06 NOTE — ED Triage Notes (Signed)
Pt arrived by EMS from home. Pt was seen earlier today for dental abscess. Pt reports more pain and swelling in the past couple hours.

## 2018-01-11 DIAGNOSIS — Z5181 Encounter for therapeutic drug level monitoring: Secondary | ICD-10-CM | POA: Diagnosis not present

## 2018-01-20 DIAGNOSIS — Z5181 Encounter for therapeutic drug level monitoring: Secondary | ICD-10-CM | POA: Diagnosis not present

## 2018-01-25 DIAGNOSIS — Z5181 Encounter for therapeutic drug level monitoring: Secondary | ICD-10-CM | POA: Diagnosis not present

## 2018-02-03 DIAGNOSIS — Z5181 Encounter for therapeutic drug level monitoring: Secondary | ICD-10-CM | POA: Diagnosis not present

## 2018-05-17 ENCOUNTER — Emergency Department (HOSPITAL_COMMUNITY): Payer: Medicare Other

## 2018-05-17 ENCOUNTER — Emergency Department (HOSPITAL_COMMUNITY)
Admission: EM | Admit: 2018-05-17 | Discharge: 2018-05-18 | Disposition: A | Discharge status: Home / Self Care | Payer: Medicare Other | Attending: Emergency Medicine | Admitting: Emergency Medicine

## 2018-05-17 ENCOUNTER — Encounter (HOSPITAL_COMMUNITY): Payer: Self-pay | Admitting: Emergency Medicine

## 2018-05-17 ENCOUNTER — Other Ambulatory Visit: Payer: Self-pay

## 2018-05-17 DIAGNOSIS — J40 Bronchitis, not specified as acute or chronic: Secondary | ICD-10-CM | POA: Insufficient documentation

## 2018-05-17 DIAGNOSIS — F17210 Nicotine dependence, cigarettes, uncomplicated: Secondary | ICD-10-CM | POA: Insufficient documentation

## 2018-05-17 DIAGNOSIS — R079 Chest pain, unspecified: Secondary | ICD-10-CM

## 2018-05-17 DIAGNOSIS — R059 Cough, unspecified: Secondary | ICD-10-CM

## 2018-05-17 DIAGNOSIS — Z79899 Other long term (current) drug therapy: Secondary | ICD-10-CM | POA: Diagnosis not present

## 2018-05-17 DIAGNOSIS — R05 Cough: Secondary | ICD-10-CM | POA: Diagnosis not present

## 2018-05-17 DIAGNOSIS — R0602 Shortness of breath: Secondary | ICD-10-CM | POA: Insufficient documentation

## 2018-05-17 DIAGNOSIS — R0789 Other chest pain: Secondary | ICD-10-CM | POA: Diagnosis not present

## 2018-05-17 HISTORY — DX: Unspecified asthma, uncomplicated: J45.909

## 2018-05-17 LAB — BASIC METABOLIC PANEL
Anion gap: 7 (ref 5–15)
BUN: 9 mg/dL (ref 6–20)
CALCIUM: 8.6 mg/dL — AB (ref 8.9–10.3)
CO2: 27 mmol/L (ref 22–32)
CREATININE: 1.08 mg/dL (ref 0.61–1.24)
Chloride: 102 mmol/L (ref 98–111)
GFR calc Af Amer: >60 mL/min (ref >60)
GFR calc non Af Amer: >60 mL/min (ref >60)
GLUCOSE: 91 mg/dL (ref 70–99)
Potassium: 3.6 mmol/L (ref 3.5–5.1)
Sodium: 136 mmol/L (ref 135–145)

## 2018-05-17 LAB — CBC
HCT: 42.3 % (ref 39.0–52.0)
Hemoglobin: 13.1 g/dL (ref 13.0–17.0)
MCH: 29.4 pg (ref 26.0–34.0)
MCHC: 31 g/dL (ref 30.0–36.0)
MCV: 95.1 fL (ref 80.0–100.0)
PLATELETS: 297 10*3/uL (ref 150–400)
RBC: 4.45 MIL/uL (ref 4.22–5.81)
RDW: 12.3 % (ref 11.5–15.5)
WBC: 7 10*3/uL (ref 4.0–10.5)
nRBC: 0 % (ref 0.0–0.2)

## 2018-05-17 LAB — I-STAT TROPONIN, ED: Troponin i, poc: 0 ng/mL (ref 0.00–0.08)

## 2018-05-17 MED ORDER — ALBUTEROL SULFATE (2.5 MG/3ML) 0.083% IN NEBU
5.0000 mg | INHALATION_SOLUTION | Freq: Once | RESPIRATORY_TRACT | Status: AC
  Administered 2018-05-17: 5 mg via RESPIRATORY_TRACT
  Filled 2018-05-17: qty 6

## 2018-05-17 NOTE — ED Triage Notes (Signed)
Pt reports onset of chest pain and SOB (pt believes it may be asthma) states that he has been coughing, and coughing up yellow mucus.

## 2018-05-18 DIAGNOSIS — R05 Cough: Secondary | ICD-10-CM | POA: Diagnosis not present

## 2018-05-18 MED ORDER — ALBUTEROL SULFATE (2.5 MG/3ML) 0.083% IN NEBU
5.0000 mg | INHALATION_SOLUTION | Freq: Once | RESPIRATORY_TRACT | Status: AC
  Administered 2018-05-18: 5 mg via RESPIRATORY_TRACT
  Filled 2018-05-18: qty 6

## 2018-05-18 MED ORDER — BENZONATATE 100 MG PO CAPS: 100.0000 mg | ORAL_CAPSULE | Freq: Three times a day (TID) | ORAL | 0 refills | Status: DC

## 2018-05-18 MED ORDER — PREDNISONE 20 MG PO TABS
60.0000 mg | ORAL_TABLET | Freq: Once | ORAL | Status: AC
  Administered 2018-05-18: 60 mg via ORAL
  Filled 2018-05-18: qty 3

## 2018-05-18 MED ORDER — IPRATROPIUM BROMIDE 0.02 % IN SOLN
0.5000 mg | Freq: Once | RESPIRATORY_TRACT | Status: AC
  Administered 2018-05-18: 0.5 mg via RESPIRATORY_TRACT
  Filled 2018-05-18: qty 2.5

## 2018-05-18 MED ORDER — ALBUTEROL SULFATE HFA 108 (90 BASE) MCG/ACT IN AERS: 1.0000 | INHALATION_SPRAY | Freq: Four times a day (QID) | RESPIRATORY_TRACT | 0 refills | Status: DC | PRN

## 2018-05-18 MED ORDER — PREDNISONE 20 MG PO TABS: ORAL_TABLET | ORAL | 0 refills | Status: DC

## 2018-05-18 NOTE — Discharge Instructions (Signed)
Take the prescribed medication as directed. °Follow-up with your primary care doctor. °Return to the ED for new or worsening symptoms. °

## 2018-05-18 NOTE — ED Provider Notes (Signed)
[facility] EMERGENCY DEPARTMENT Provider Note   CSN: [id number] Arrival date & time: 05/17/18  2230     History   Chief Complaint Chief Complaint  Patient presents with  . Chest Pain  . Asthma    HPI Gene Rivera is a 37 y.o. male.  The history is provided by the patient and medical records.     37 year old male with history of asthma as well as bipolar affective disorder, presenting to the ED for chest pain and shortness of breath.  States for the past few days he has had a productive cough with thick, yellow mucus as well as shortness of breath.  States his chest overall feels tight.  He denies any palpitations, dizziness, weakness, numbness, diaphoresis, nausea, or vomiting.  He has not had any fever or chills.  Denies any sick contacts.  States he feels like majority of his symptoms are related to his asthma as he usually has flareups when the season changes.  He states he has been out of his inhaler for about 2 weeks but has not felt that he needed it until today.  He is a heavy smoker but is trying to cut back.  Past Medical History:  Diagnosis Date  . Asthma   . Bipolar affective disorder (HCC)     There are no active problems to display for this patient.   Past Surgical History:  Procedure Laterality Date  . LOBECTOMY Left 1990  . LUNG SURGERY          Home Medications    Prior to Admission medications   Medication Sig Start Date End Date Taking? Authorizing Provider  albuterol (PROVENTIL HFA;VENTOLIN HFA) 108 (90 Base) MCG/ACT inhaler Inhale 1-2 puffs into the lungs every 6 (six) hours as needed for wheezing or shortness of breath. 09/09/17   Rancour, Jeannett Senior, MD  chlorhexidine (PERIDEX) 0.12 % solution Swish and spit out 15ml in mouth twice daily for 1-2 weeks. 01/05/18   Little, Ambrose Finland, MD  chlorproMAZINE HCl (THORAZINE PO) Take 1 tablet by mouth daily.    [provider]  ibuprofen (ADVIL,MOTRIN) 800 MG tablet Take 1  tablet (800 mg total) by mouth 3 (three) times daily. 01/05/18   Little, Ambrose Finland, MD  PARoxetine (PAXIL) 20 MG tablet Take 20 mg by mouth daily.    [provider]  traZODone (DESYREL) 50 MG tablet Take 50 mg by mouth at bedtime as needed for sleep.    [provider]    Family History Family History  Problem Relation Age of Onset  . Diabetes Mother   . Hypertension Mother     Social History Social History   Tobacco Use  . Smoking status: Current Every Day Smoker    Packs/day: 0.50    Types: Cigarettes  . Smokeless tobacco: Never Used  Substance Use Topics  . Alcohol use: Yes    Comment: Everyday. Last drink: 6 hrs ago  . Drug use: Not Currently    Types: Marijuana, Cocaine     Allergies   Penicillins   Review of Systems Review of Systems  Respiratory: Positive for shortness of breath and wheezing.   Cardiovascular: Positive for chest pain.  All other systems reviewed and are negative.    Physical Exam Updated Vital Signs BP 116/83 (BP Location: Right Arm)   Pulse 78   Temp 99 F (37.2 C) (Oral)   Resp 18   Ht 5\' 9"  (1.753 m)   Wt 70.3 kg  SpO2 97%   BMI 22.89 kg/m   Physical Exam  Constitutional: He is oriented to person, place, and time. He appears well-developed and well-nourished.  HENT:  Head: Normocephalic and atraumatic.  Mouth/Throat: Oropharynx is clear and moist.  Eyes: Pupils are equal, round, and reactive to light. Conjunctivae and EOM are normal.  Neck: Normal range of motion.  Cardiovascular: Normal rate, regular rhythm and normal heart sounds.  Pulmonary/Chest: Effort normal. He has wheezes.  Diffuse inspiratory and expiratory wheezes but no acute distress, able speak in full sentences without difficulty  Abdominal: Soft. Bowel sounds are normal.  Musculoskeletal: Normal range of motion.  Neurological: He is alert and oriented to person, place, and time.  Skin: Skin is warm and dry.  Psychiatric: He has a normal  mood and affect.  Nursing note and vitals reviewed.    ED Treatments / Results  Labs (all labs ordered are listed, but only abnormal results are displayed) Labs Reviewed  BASIC METABOLIC PANEL - Abnormal; Notable for the following components:      Result Value   Calcium 8.6 (*)    All other components within normal limits  CBC  I-STAT TROPONIN, ED    EKG None  Radiology Dg Chest 2 View  Result Date: 05/17/2018 CLINICAL DATA:  Chest pain and shortness of breath. History of asthma. EXAM: CHEST - 2 VIEW COMPARISON:  12/04/2017 FINDINGS: The cardiomediastinal silhouette is within normal limits. The lungs are well inflated and clear. There is no evidence of pleural effusion or pneumothorax. No acute osseous abnormality is identified. IMPRESSION: No active cardiopulmonary disease. Electronically Signed   By: Sebastian Ache M.D.   On: 05/17/2018 23:26    Procedures Procedures (including critical care time)  Medications Ordered in ED Medications  predniSONE (DELTASONE) tablet 60 mg (has no administration in time range)  albuterol (PROVENTIL) (2.5 MG/3ML) 0.083% nebulizer solution 5 mg (5 mg Nebulization Given 05/17/18 2249)  albuterol (PROVENTIL) (2.5 MG/3ML) 0.083% nebulizer solution 5 mg (5 mg Nebulization Given 05/18/18 0343)  ipratropium (ATROVENT) nebulizer solution 0.5 mg (0.5 mg Nebulization Given 05/18/18 0343)     Initial Impression / Assessment and Plan / ED Course  I have reviewed the triage vital signs and the nursing notes.  Pertinent labs & imaging results that were available during my care of the patient were reviewed by me and considered in my medical decision making (see chart for details).  37 year old male here with chest pain.  Reports productive cough with thick, yellow mucus as well as some wheezing.  Has been out of his inhaler for about 2 weeks.  He is afebrile and nontoxic in appearance.  Does have diffuse inspiratory and expiratory wheezes but no acute  distress, able speak in full sentences without difficulty.  Does have wet sounding cough.  Labs and chest x-ray overall reassuring.  EKG is nonischemic.  Patient given dose of prednisone and neb treatment here with good improvement.  His vitals have remained stable.  Suspect he likely has bronchitis/viral URI with acute asthma exacerbation.  We will treat symptomatically.  He will follow-up closely with his primary care doctor.  He will return here for any new or worsening symptoms.  Final Clinical Impressions(s) / ED Diagnoses   Final diagnoses:  Chest pain in adult  Cough  Bronchitis    ED Discharge Orders         Ordered    albuterol (PROVENTIL HFA;VENTOLIN HFA) 108 (90 Base) MCG/ACT inhaler  Every 6 hours PRN  05/18/18 0443    benzonatate (TESSALON) 100 MG capsule  Every 8 hours     05/18/18 0443    predniSONE (DELTASONE) 20 MG tablet     05/18/18 0443           Garlon Hatchet, PA-C 05/18/18 0447    Dione Booze, MD 05/18/18 [phone]

## 2018-06-28 DIAGNOSIS — Z5181 Encounter for therapeutic drug level monitoring: Secondary | ICD-10-CM | POA: Diagnosis not present

## 2018-07-22 DIAGNOSIS — Z7251 High risk heterosexual behavior: Secondary | ICD-10-CM | POA: Diagnosis not present

## 2018-07-22 DIAGNOSIS — A638 Other specified predominantly sexually transmitted diseases: Secondary | ICD-10-CM | POA: Diagnosis not present

## 2018-07-24 ENCOUNTER — Emergency Department (HOSPITAL_COMMUNITY)
Admission: EM | Admit: 2018-07-24 | Discharge: 2018-07-25 | Disposition: A | Discharge status: Home / Self Care | Payer: Medicare Other | Attending: Emergency Medicine | Admitting: Emergency Medicine

## 2018-07-24 DIAGNOSIS — M25572 Pain in left ankle and joints of left foot: Secondary | ICD-10-CM | POA: Diagnosis not present

## 2018-07-24 DIAGNOSIS — J45909 Unspecified asthma, uncomplicated: Secondary | ICD-10-CM | POA: Diagnosis not present

## 2018-07-24 DIAGNOSIS — X509XXA Other and unspecified overexertion or strenuous movements or postures, initial encounter: Secondary | ICD-10-CM | POA: Diagnosis not present

## 2018-07-24 DIAGNOSIS — S93402A Sprain of unspecified ligament of left ankle, initial encounter: Secondary | ICD-10-CM | POA: Diagnosis not present

## 2018-07-24 DIAGNOSIS — F17210 Nicotine dependence, cigarettes, uncomplicated: Secondary | ICD-10-CM | POA: Diagnosis not present

## 2018-07-24 DIAGNOSIS — Y998 Other external cause status: Secondary | ICD-10-CM | POA: Diagnosis not present

## 2018-07-24 DIAGNOSIS — Y9301 Activity, walking, marching and hiking: Secondary | ICD-10-CM | POA: Insufficient documentation

## 2018-07-24 DIAGNOSIS — Y92480 Sidewalk as the place of occurrence of the external cause: Secondary | ICD-10-CM | POA: Insufficient documentation

## 2018-07-24 DIAGNOSIS — S99912A Unspecified injury of left ankle, initial encounter: Secondary | ICD-10-CM | POA: Diagnosis not present

## 2018-07-25 ENCOUNTER — Other Ambulatory Visit: Payer: Self-pay

## 2018-07-25 ENCOUNTER — Emergency Department (HOSPITAL_COMMUNITY): Payer: Medicare Other

## 2018-07-25 ENCOUNTER — Encounter (HOSPITAL_COMMUNITY): Payer: Self-pay | Admitting: Emergency Medicine

## 2018-07-25 DIAGNOSIS — S99912A Unspecified injury of left ankle, initial encounter: Secondary | ICD-10-CM | POA: Diagnosis not present

## 2018-07-25 DIAGNOSIS — S93402A Sprain of unspecified ligament of left ankle, initial encounter: Secondary | ICD-10-CM | POA: Diagnosis not present

## 2018-07-25 DIAGNOSIS — M25572 Pain in left ankle and joints of left foot: Secondary | ICD-10-CM | POA: Diagnosis not present

## 2018-07-25 MED ORDER — IBUPROFEN 800 MG PO TABS: 800.0000 mg | ORAL_TABLET | Freq: Three times a day (TID) | ORAL | 0 refills | Status: DC

## 2018-07-25 NOTE — ED Provider Notes (Signed)
[facility] EMERGENCY DEPARTMENT Provider Note   CSN: [id number] Arrival date & time: 07/24/18  2356     History   Chief Complaint Chief Complaint  Patient presents with  . Ankle Pain    HPI Gene Rivera is a 38 y.o. male.  Patient presents to the emergency department with a chief complaint of left ankle pain.  He states that he was walking and rolled his ankle on the curb.  He denies any other injuries.  Denies any treatments prior to arrival.  Pain is worsened with ambulation.  The history is provided by the patient. No language interpreter was used.    Past Medical History:  Diagnosis Date  . Asthma   . Bipolar affective disorder (HCC)     There are no active problems to display for this patient.   Past Surgical History:  Procedure Laterality Date  . LOBECTOMY Left 1990  . LUNG SURGERY          Home Medications    Prior to Admission medications   Medication Sig Start Date End Date Taking? Authorizing Provider  albuterol (PROVENTIL HFA;VENTOLIN HFA) 108 (90 Base) MCG/ACT inhaler Inhale 1-2 puffs into the lungs every 6 (six) hours as needed for wheezing. 05/18/18   Garlon Hatchet, PA-C  benzonatate (TESSALON) 100 MG capsule Take 1 capsule (100 mg total) by mouth every 8 (eight) hours. 05/18/18   Garlon Hatchet, PA-C  chlorhexidine (PERIDEX) 0.12 % solution Swish and spit out 61ml in mouth twice daily for 1-2 weeks. Patient not taking: Reported on 05/18/2018 01/05/18   Little, Ambrose Finland, MD  ibuprofen (ADVIL,MOTRIN) 800 MG tablet Take 1 tablet (800 mg total) by mouth 3 (three) times daily. 07/25/18   Roxy Horseman, PA-C  predniSONE (DELTASONE) 20 MG tablet Take 40 mg by mouth daily for 3 days, then 20mg  by mouth daily for 3 days, then 10mg  daily for 3 days 05/18/18   Garlon Hatchet, PA-C    Family History Family History  Problem Relation Age of Onset  . Diabetes Mother   . Hypertension Mother     Social History Social History    Tobacco Use  . Smoking status: Current Every Day Smoker    Packs/day: 0.50    Types: Cigarettes  . Smokeless tobacco: Never Used  Substance Use Topics  . Alcohol use: Yes    Comment: Everyday. Last drink: 6 hrs ago  . Drug use: Not Currently    Types: Marijuana, Cocaine     Allergies   Penicillins   Review of Systems Review of Systems  All other systems reviewed and are negative.    Physical Exam Updated Vital Signs BP 137/75 (BP Location: Right Arm)   Pulse (!) 113   Temp 99.4 F (37.4 C) (Oral)   Resp 17   Ht 5\' 9"  (1.753 m)   Wt 70.3 kg   SpO2 95%   BMI 22.89 kg/m   Physical Exam  Nursing note and vitals reviewed.  Constitutional: Pt appears well-developed and well-nourished. No distress.  HENT:  Head: Normocephalic and atraumatic.  Eyes: Conjunctivae are normal.  Neck: Normal range of motion.  Cardiovascular: Normal rate, regular rhythm. Intact distal pulses.   Capillary refill < 3 sec.  Pulmonary/Chest: Effort normal and breath sounds normal.  Musculoskeletal:  Left ankle Pt exhibits tender to palpation laterally, no bony abnormality or deformity, no significant swelling.   ROM: 4/5  Strength: 4/5 Neurological: Pt  is alert. Coordination normal.  Sensation:5/5  Skin: Skin is warm and dry. Pt is not diaphoretic.  No evidence of open wound or skin tenting Psychiatric: Pt has a normal mood and affect.    ED Treatments / Results  Labs (all labs ordered are listed, but only abnormal results are displayed) Labs Reviewed - No data to display  EKG None  Radiology Dg Ankle Complete Left  Result Date: 07/25/2018 CLINICAL DATA:  Larey Seat with twisting injury to the left ankle tonight. Pain when walking. EXAM: LEFT ANKLE COMPLETE - 3+ VIEW COMPARISON:  None. FINDINGS: There is no evidence of fracture, dislocation, or joint effusion. There is no evidence of arthropathy or other focal bone abnormality. Soft tissues are unremarkable. IMPRESSION: Negative.  Electronically Signed   By: Burman Nieves M.D.   On: 07/25/2018 01:04    Procedures Procedures (including critical care time)  Medications Ordered in ED Medications - No data to display   Initial Impression / Assessment and Plan / ED Course  I have reviewed the triage vital signs and the nursing notes.  Pertinent labs & imaging results that were available during my care of the patient were reviewed by me and considered in my medical decision making (see chart for details).     Patient presents with injury to left ankle.  DDx includes, fracture, strain, or sprain.    Plain films reveal negative.  Pt advised to follow up with PCP and/or orthopedics. Patient given ankle ASO and crutches while in ED, conservative therapy such as RICE recommended and discussed.   Patient will be discharged home & is agreeable with above plan. Returns precautions discussed. Pt appears safe for discharge.   Final Clinical Impressions(s) / ED Diagnoses   Final diagnoses:  Sprain of left ankle, unspecified ligament, initial encounter    ED Discharge Orders         Ordered    ibuprofen (ADVIL,MOTRIN) 800 MG tablet  3 times daily     07/25/18 0111           Roxy Horseman, PA-C 07/25/18 0130    Gilda Crease, MD 07/25/18 [phone]

## 2018-07-25 NOTE — ED Triage Notes (Signed)
Pt c/o left ankle pain and swelling x 1 day.

## 2018-07-27 DIAGNOSIS — Z5181 Encounter for therapeutic drug level monitoring: Secondary | ICD-10-CM | POA: Diagnosis not present

## 2018-07-29 DIAGNOSIS — M545 Low back pain: Secondary | ICD-10-CM | POA: Diagnosis not present

## 2018-07-30 DIAGNOSIS — R05 Cough: Secondary | ICD-10-CM | POA: Diagnosis not present

## 2018-07-30 DIAGNOSIS — J069 Acute upper respiratory infection, unspecified: Secondary | ICD-10-CM | POA: Diagnosis not present

## 2018-07-30 DIAGNOSIS — J22 Unspecified acute lower respiratory infection: Secondary | ICD-10-CM | POA: Diagnosis not present

## 2018-07-31 DIAGNOSIS — A638 Other specified predominantly sexually transmitted diseases: Secondary | ICD-10-CM | POA: Diagnosis not present

## 2018-07-31 DIAGNOSIS — Z7251 High risk heterosexual behavior: Secondary | ICD-10-CM | POA: Diagnosis not present

## 2018-08-04 DIAGNOSIS — Z5181 Encounter for therapeutic drug level monitoring: Secondary | ICD-10-CM | POA: Diagnosis not present

## 2018-09-02 ENCOUNTER — Other Ambulatory Visit: Payer: Self-pay

## 2018-09-02 ENCOUNTER — Encounter (HOSPITAL_COMMUNITY): Payer: Self-pay | Admitting: Emergency Medicine

## 2018-09-02 ENCOUNTER — Emergency Department (HOSPITAL_COMMUNITY)
Admission: EM | Admit: 2018-09-02 | Discharge: 2018-09-02 | Disposition: A | Discharge status: Home / Self Care | Payer: Medicare Other | Attending: Emergency Medicine | Admitting: Emergency Medicine

## 2018-09-02 DIAGNOSIS — F314 Bipolar disorder, current episode depressed, severe, without psychotic features: Secondary | ICD-10-CM | POA: Insufficient documentation

## 2018-09-02 DIAGNOSIS — F332 Major depressive disorder, recurrent severe without psychotic features: Secondary | ICD-10-CM

## 2018-09-02 DIAGNOSIS — F10920 Alcohol use, unspecified with intoxication, uncomplicated: Secondary | ICD-10-CM | POA: Diagnosis not present

## 2018-09-02 DIAGNOSIS — R45851 Suicidal ideations: Secondary | ICD-10-CM | POA: Diagnosis not present

## 2018-09-02 DIAGNOSIS — F17210 Nicotine dependence, cigarettes, uncomplicated: Secondary | ICD-10-CM | POA: Insufficient documentation

## 2018-09-02 DIAGNOSIS — J45909 Unspecified asthma, uncomplicated: Secondary | ICD-10-CM | POA: Insufficient documentation

## 2018-09-02 DIAGNOSIS — Z880 Allergy status to penicillin: Secondary | ICD-10-CM | POA: Diagnosis not present

## 2018-09-02 DIAGNOSIS — Z008 Encounter for other general examination: Secondary | ICD-10-CM | POA: Diagnosis present

## 2018-09-02 LAB — COMPREHENSIVE METABOLIC PANEL
ALT: 24 U/L (ref 0–44)
ANION GAP: 13 (ref 5–15)
AST: 45 U/L — ABNORMAL HIGH (ref 15–41)
Albumin: 3.5 g/dL (ref 3.5–5.0)
Alkaline Phosphatase: 41 U/L (ref 38–126)
BUN: 11 mg/dL (ref 6–20)
CO2: 24 mmol/L (ref 22–32)
Calcium: 8.5 mg/dL — ABNORMAL LOW (ref 8.9–10.3)
Chloride: 101 mmol/L (ref 98–111)
Creatinine, Ser: 1 mg/dL (ref 0.61–1.24)
GFR calc non Af Amer: >60 mL/min (ref >60)
Glucose, Bld: 80 mg/dL (ref 70–99)
Potassium: 4.2 mmol/L (ref 3.5–5.1)
Sodium: 138 mmol/L (ref 135–145)
Total Bilirubin: 0.7 mg/dL (ref 0.3–1.2)
Total Protein: 6.4 g/dL — ABNORMAL LOW (ref 6.5–8.1)

## 2018-09-02 LAB — CBC
HCT: 45.3 % (ref 39.0–52.0)
Hemoglobin: 14.1 g/dL (ref 13.0–17.0)
MCH: 29.1 pg (ref 26.0–34.0)
MCHC: 31.1 g/dL (ref 30.0–36.0)
MCV: 93.4 fL (ref 80.0–100.0)
PLATELETS: 278 10*3/uL (ref 150–400)
RBC: 4.85 MIL/uL (ref 4.22–5.81)
RDW: 12.2 % (ref 11.5–15.5)
WBC: 6.8 10*3/uL (ref 4.0–10.5)
nRBC: 0 % (ref 0.0–0.2)

## 2018-09-02 LAB — ETHANOL: Alcohol, Ethyl (B): 134 mg/dL — ABNORMAL HIGH (ref <10)

## 2018-09-02 LAB — ACETAMINOPHEN LEVEL

## 2018-09-02 LAB — SALICYLATE LEVEL: Salicylate Lvl: <7 mg/dL (ref 2.8–30.0)

## 2018-09-02 NOTE — ED Triage Notes (Signed)
Pt requesting detox- last ETOH 1 hour ago.  Last used marijuana 5 hours ago.  States he is suicidal but denies plan.

## 2018-09-02 NOTE — ED Notes (Signed)
Pt given discharge instructions and resources.  Pt did not want to leave however was told he could sit in the waiting room until bus ran.  Was also given a soda and sandwich bag.   Pt finally got dressed and went to the waiting room.

## 2018-09-02 NOTE — ED Provider Notes (Signed)
[facility] EMERGENCY DEPARTMENT Provider Note   CSN: [id number] Arrival date & time: 09/02/18  0245    History   Chief Complaint Chief Complaint  Patient presents with  . Suicidal    HPI Gene Rivera is a 38 y.o. male.  The history is provided by the patient.  He has history of asthma and bipolar disorder and comes in stating he wants to get some help with his alcohol abuse.  He admits to drinking a lot of beers as well as smoking marijuana.  He does admit to depression and has constitutional symptoms including crying spells, early morning awakening, and anhedonia.  He denies hallucinations and denies homicidal ideation and suicidal ideation.  He does have a psychiatrist.  Past Medical History:  Diagnosis Date  . Asthma   . Bipolar affective disorder (HCC)     There are no active problems to display for this patient.   Past Surgical History:  Procedure Laterality Date  . LOBECTOMY Left 1990  . LUNG SURGERY          Home Medications    Prior to Admission medications   Medication Sig Start Date End Date Taking? Authorizing Provider  albuterol (PROVENTIL HFA;VENTOLIN HFA) 108 (90 Base) MCG/ACT inhaler Inhale 1-2 puffs into the lungs every 6 (six) hours as needed for wheezing. 05/18/18   Garlon Hatchet, PA-C  benzonatate (TESSALON) 100 MG capsule Take 1 capsule (100 mg total) by mouth every 8 (eight) hours. 05/18/18   Garlon Hatchet, PA-C  chlorhexidine (PERIDEX) 0.12 % solution Swish and spit out 10ml in mouth twice daily for 1-2 weeks. Patient not taking: Reported on 05/18/2018 01/05/18   Little, Ambrose Finland, MD  ibuprofen (ADVIL,MOTRIN) 800 MG tablet Take 1 tablet (800 mg total) by mouth 3 (three) times daily. 07/25/18   Roxy Horseman, PA-C  predniSONE (DELTASONE) 20 MG tablet Take 40 mg by mouth daily for 3 days, then 20mg  by mouth daily for 3 days, then 10mg  daily for 3 days 05/18/18   Garlon Hatchet, PA-C    Family History Family History    Problem Relation Age of Onset  . Diabetes Mother   . Hypertension Mother     Social History Social History   Tobacco Use  . Smoking status: Current Every Day Smoker    Packs/day: 0.50    Types: Cigarettes  . Smokeless tobacco: Never Used  Substance Use Topics  . Alcohol use: Yes  . Drug use: Yes    Types: Marijuana, Cocaine     Allergies   Penicillins   Review of Systems Review of Systems  All other systems reviewed and are negative.    Physical Exam Updated Vital Signs BP 127/72 (BP Location: Right Arm)   Pulse 72   Temp 97.7 F (36.5 C) (Oral)   Resp 16   SpO2 99%   Physical Exam Vitals signs and nursing note reviewed.    38 year old male, resting comfortably and in no acute distress. Vital signs are normal. Oxygen saturation is 99%, which is normal. Head is normocephalic and atraumatic. PERRLA, EOMI. Oropharynx is clear. Neck is nontender and supple without adenopathy or JVD. Back is nontender and there is no CVA tenderness. Lungs are clear without rales, wheezes, or rhonchi. Chest is nontender. Heart has regular rate and rhythm without murmur. Abdomen is soft, flat, nontender without masses or hepatosplenomegaly and peristalsis is normoactive. Extremities have no cyanosis or edema, full range of motion is present. Skin is  warm and dry without rash. Neurologic: Mental status is normal, cranial nerves are intact, there are no motor or sensory deficits. Psychiatric: Depressed affect.  ED Treatments / Results  Labs (all labs ordered are listed, but only abnormal results are displayed) Labs Reviewed  COMPREHENSIVE METABOLIC PANEL - Abnormal; Notable for the following components:      Result Value   Calcium 8.5 (*)    Total Protein 6.4 (*)    AST 45 (*)    All other components within normal limits  ETHANOL - Abnormal; Notable for the following components:   Alcohol, Ethyl (B) 134 (*)    All other components within normal limits  ACETAMINOPHEN LEVEL  - Abnormal; Notable for the following components:   Acetaminophen (Tylenol), Serum <10 (*)    All other components within normal limits  SALICYLATE LEVEL  CBC  RAPID URINE DRUG SCREEN, HOSP PERFORMED   Procedures Procedures   Medications Ordered in ED Medications - No data to display   Initial Impression / Assessment and Plan / ED Course  I have reviewed the triage vital signs and the nursing notes.  Pertinent lab results that were available during my care of the patient were reviewed by me and considered in my medical decision making (see chart for details).  Alcohol intoxication.  Depression.  Old records are reviewed, and he has no relevant past visits.  Labs do show ethanol intoxication but are otherwise unremarkable.  I did note triage note stating that he was suicidal without a plan.  He specifically denies suicidal ideation on my exam.  He is felt to be safe for discharge to pursue outpatient detox.  He is encouraged to follow-up with his psychiatrist to adjust his medications.  Return precautions discussed.  Final Clinical Impressions(s) / ED Diagnoses   Final diagnoses:  Alcohol intoxication, uncomplicated (HCC)  Severe episode of recurrent major depressive disorder, without psychotic features [facility])    ED Discharge Orders    None       Dione Booze, MD 09/02/18 [phone]

## 2018-09-02 NOTE — ED Notes (Addendum)
Pt walked back to room.  When RN attempted to speak to him he pulled the blanket over his head and said "I'm suicidal, that's all"  Pt sitter bedside

## 2018-09-02 NOTE — ED Notes (Addendum)
Pt changed into paper scrubs, belongings bagged and placed in [place] [address] by Mikle Bosworth, EMT, security wanded pt, and sitter ordered.

## 2018-10-14 DIAGNOSIS — F250 Schizoaffective disorder, bipolar type: Secondary | ICD-10-CM | POA: Diagnosis not present

## 2018-10-27 DIAGNOSIS — I498 Other specified cardiac arrhythmias: Secondary | ICD-10-CM | POA: Diagnosis not present

## 2018-10-30 DIAGNOSIS — F319 Bipolar disorder, unspecified: Secondary | ICD-10-CM | POA: Diagnosis not present

## 2018-12-24 ENCOUNTER — Other Ambulatory Visit: Payer: Self-pay

## 2018-12-24 ENCOUNTER — Emergency Department (HOSPITAL_COMMUNITY)
Admission: EM | Admit: 2018-12-24 | Discharge: 2018-12-24 | Disposition: A | Discharge status: Home / Self Care | Payer: Medicare Other | Attending: Emergency Medicine | Admitting: Emergency Medicine

## 2018-12-24 ENCOUNTER — Encounter (HOSPITAL_COMMUNITY): Payer: Self-pay | Admitting: Emergency Medicine

## 2018-12-24 DIAGNOSIS — J45909 Unspecified asthma, uncomplicated: Secondary | ICD-10-CM | POA: Diagnosis not present

## 2018-12-24 DIAGNOSIS — F17210 Nicotine dependence, cigarettes, uncomplicated: Secondary | ICD-10-CM | POA: Insufficient documentation

## 2018-12-24 DIAGNOSIS — K0889 Other specified disorders of teeth and supporting structures: Secondary | ICD-10-CM | POA: Insufficient documentation

## 2018-12-24 DIAGNOSIS — Z760 Encounter for issue of repeat prescription: Secondary | ICD-10-CM | POA: Diagnosis not present

## 2018-12-24 DIAGNOSIS — Z79899 Other long term (current) drug therapy: Secondary | ICD-10-CM | POA: Diagnosis not present

## 2018-12-24 MED ORDER — ACETAMINOPHEN 325 MG PO TABS
650.0000 mg | ORAL_TABLET | Freq: Once | ORAL | Status: DC
  Filled 2018-12-24: qty 2

## 2018-12-24 MED ORDER — CLINDAMYCIN HCL 150 MG PO CAPS
300.0000 mg | ORAL_CAPSULE | Freq: Once | ORAL | Status: AC
  Administered 2018-12-24: 300 mg via ORAL
  Filled 2018-12-24: qty 2

## 2018-12-24 MED ORDER — CLINDAMYCIN HCL 150 MG PO CAPS: 300.0000 mg | ORAL_CAPSULE | Freq: Three times a day (TID) | ORAL | 0 refills | Status: DC

## 2018-12-24 MED ORDER — ALBUTEROL SULFATE HFA 108 (90 BASE) MCG/ACT IN AERS: 2.0000 | INHALATION_SPRAY | RESPIRATORY_TRACT | 0 refills | Status: DC | PRN

## 2018-12-24 NOTE — ED Notes (Signed)
Pt discharged with all belongings. Discharge instructions reviewed with pt, and pt verbalized understanding. Opportunity for questions provided.  

## 2018-12-24 NOTE — ED Provider Notes (Signed)
[facility]   CSN: [id number] Arrival date & time: 12/24/18  2049     History   Chief Complaint Chief Complaint  Patient presents with  . Dental Pain    HPI Gene Rivera is a 38 y.o. male.     Patient presents to the emergency department with a chief complaint of dental pain.  He states that the pain started today, but he noticed some swelling about 5 days ago.  He rates pain as severe.  He reports increased pain with chewing and eating.  He denies any fever or chills.  He does not have a dentist.  He denies taking any medications for this.  The history is provided by the patient. No language interpreter was used.    Past Medical History:  Diagnosis Date  . Asthma   . Bipolar affective disorder (HCC)     There are no active problems to display for this patient.   Past Surgical History:  Procedure Laterality Date  . LOBECTOMY Left 1990  . LUNG SURGERY          Home Medications    Prior to Admission medications   Medication Sig Start Date End Date Taking? Authorizing Provider  albuterol (PROVENTIL HFA;VENTOLIN HFA) 108 (90 Base) MCG/ACT inhaler Inhale 1-2 puffs into the lungs every 6 (six) hours as needed for wheezing. 05/18/18  Yes Garlon HatchetSanders, Lisa M, PA-C  ibuprofen (ADVIL,MOTRIN) 800 MG tablet Take 1 tablet (800 mg total) by mouth 3 (three) times daily. 07/25/18  Yes Roxy HorsemanBrowning, Alishah Schulte, PA-C  INVEGA SUSTENNA 234 MG/1.5ML SUSY injection Inject 234 mg into the muscle every 28 (twenty-eight) days.  10/13/18  Yes [provider]  traZODone (DESYREL) 100 MG tablet Take 100 mg by mouth at bedtime.  11/01/18  Yes [provider]    Family History Family History  Problem Relation Age of Onset  . Diabetes Mother   . Hypertension Mother     Social History Social History   Tobacco Use  . Smoking status: Current Every Day Smoker    Packs/day: 0.50    Types: Cigarettes  . Smokeless tobacco: Never  Used  Substance Use Topics  . Alcohol use: Yes  . Drug use: Yes    Types: Marijuana, Cocaine     Allergies   Penicillins   Review of Systems Review of Systems  All other systems reviewed and are negative.    Physical Exam Updated Vital Signs BP (!) 134/91 (BP Location: Right Arm)   Pulse 80   Temp 97.9 F (36.6 C) (Oral)   Resp 16   Ht 5\' 9"  (1.753 m)   Wt 72.6 kg   SpO2 100%   BMI 23.63 kg/m   Physical Exam Physical Exam  Constitutional: Pt appears well-developed and well-nourished.  HENT:  Head: Normocephalic.  Right Ear: Tympanic membrane, external ear and ear canal normal.  Left Ear: Tympanic membrane, external ear and ear canal normal.  Nose: Nose normal. Right sinus exhibits no maxillary sinus tenderness and no frontal sinus tenderness. Left sinus exhibits no maxillary sinus tenderness and no frontal sinus tenderness.  Mouth/Throat: Uvula is midline, oropharynx is clear and moist and mucous membranes are normal. No oral lesions. No uvula swelling or lacerations. No oropharyngeal exudate, posterior oropharyngeal edema, posterior oropharyngeal erythema or tonsillar abscesses.  Poor dentition No gingival swelling, fluctuance or induration No gross abscess  No sublingual edema, tenderness to palpation, or sign of Ludwig's angina, or deep space infection Pain  at right lower rear molar Eyes: Conjunctivae are normal. Pupils are equal, round, and reactive to light. Right eye exhibits no discharge. Left eye exhibits no discharge.  Neck: Normal range of motion. Neck supple.  No stridor Handling secretions without difficulty No nuchal rigidity No cervical lymphadenopathy Cardiovascular: Normal rate, regular rhythm and normal heart sounds.   Pulmonary/Chest: Effort normal. No respiratory distress.  Equal chest rise  Abdominal: Soft. Bowel sounds are normal. Pt exhibits no distension. There is no tenderness.  Lymphadenopathy: Pt has no cervical adenopathy.   Neurological: Pt is alert and oriented x 4  Skin: Skin is warm and dry.  Psychiatric: Pt has a normal mood and affect.  Nursing note and vitals reviewed.    ED Treatments / Results  Labs (all labs ordered are listed, but only abnormal results are displayed) Labs Reviewed - No data to display  EKG    Radiology No results found.  Procedures Procedures (including critical care time)  Medications Ordered in ED Medications  clindamycin (CLEOCIN) capsule 300 mg (has no administration in time range)     Initial Impression / Assessment and Plan / ED Course  I have reviewed the triage vital signs and the nursing notes.  Pertinent labs & imaging results that were available during my care of the patient were reviewed by me and considered in my medical decision making (see chart for details).        Patient with dentalgia.  No abscess requiring immediate incision and drainage.  Exam not concerning for Ludwig's angina or pharyngeal abscess.  Will treat with clindamycin. Pt instructed to follow-up with dentist.  Discussed return precautions. Pt safe for discharge.   Final Clinical Impressions(s) / ED Diagnoses   Final diagnoses:  Pain, dental  Medication refill    ED Discharge Orders    None       [name], PA-C 12/24/18 2246    [name], [name], MD 12/26/18 [phone]

## 2018-12-24 NOTE — ED Triage Notes (Signed)
Pt in POV reports R lower dental pain X few days.

## 2018-12-24 NOTE — ED Triage Notes (Signed)
Pt states he has dental abscess on lower right side x 5 days. States 10/10 pain, unable to eat or lie down.

## 2019-06-24 ENCOUNTER — Ambulatory Visit (HOSPITAL_COMMUNITY)
Admission: EM | Admit: 2019-06-24 | Discharge: 2019-06-24 | Disposition: A | Discharge status: Home / Self Care | Payer: Medicare Other | Attending: Family Medicine | Admitting: Family Medicine

## 2019-06-24 ENCOUNTER — Encounter (HOSPITAL_COMMUNITY): Payer: Self-pay

## 2019-06-24 ENCOUNTER — Other Ambulatory Visit: Payer: Self-pay

## 2019-06-24 ENCOUNTER — Ambulatory Visit (INDEPENDENT_AMBULATORY_CARE_PROVIDER_SITE_OTHER): Payer: Medicare Other

## 2019-06-24 DIAGNOSIS — W19XXXA Unspecified fall, initial encounter: Secondary | ICD-10-CM | POA: Diagnosis not present

## 2019-06-24 DIAGNOSIS — S6991XA Unspecified injury of right wrist, hand and finger(s), initial encounter: Secondary | ICD-10-CM

## 2019-06-24 DIAGNOSIS — J4520 Mild intermittent asthma, uncomplicated: Secondary | ICD-10-CM | POA: Diagnosis not present

## 2019-06-24 DIAGNOSIS — Z760 Encounter for issue of repeat prescription: Secondary | ICD-10-CM

## 2019-06-24 DIAGNOSIS — S59911A Unspecified injury of right forearm, initial encounter: Secondary | ICD-10-CM

## 2019-06-24 MED ORDER — ALBUTEROL SULFATE HFA 108 (90 BASE) MCG/ACT IN AERS: 2.0000 | INHALATION_SPRAY | RESPIRATORY_TRACT | 0 refills | Status: DC | PRN

## 2019-06-24 MED ORDER — IBUPROFEN 800 MG PO TABS: 800.0000 mg | ORAL_TABLET | Freq: Three times a day (TID) | ORAL | 0 refills | Status: DC

## 2019-06-24 MED ORDER — PREDNISONE 50 MG PO TABS: 50.0000 mg | ORAL_TABLET | Freq: Every day | ORAL | 0 refills | Status: AC

## 2019-06-24 NOTE — ED Triage Notes (Signed)
Patient presents to Urgent Care with complaints of right wrist pain since landing on it funny when he sat on the couch last night. Patient reports he would also like a refill for his inhaler.

## 2019-06-24 NOTE — ED Provider Notes (Signed)
[facility]    CSN: [id number] Arrival date & time: 06/24/19  1443      History   Chief Complaint Chief Complaint  Patient presents with  . Wrist Pain    Right    HPI HANEEF HALLQUIST is a 38 y.o. male history of asthma, bipolar disorder, presenting today for evaluation of right wrist injury and medication refill.  Patient states that last night he was playing around and accidentally fell backwards and landed on his right wrist/arm awkwardly.  He is unsure exactly how he landed, but since has had pain in his right wrist and forearm.  He feels a pulling sensation when moving his fingers throughout his wrist.  Denies previous injury to the side.  Denies numbness or tingling.  Has had some mild elbow pain.  Denies difficulty bending elbow.  He also is requesting refill of albuterol inhaler.  Uses as needed.  Typically utilizes inhaler more in the fall/winter.  Does endorse tobacco use.  HPI  Past Medical History:  Diagnosis Date  . Asthma   . Bipolar affective disorder ([place])     There are no problems to display for this patient.   Past Surgical History:  Procedure Laterality Date  . LOBECTOMY Left 1990  . LUNG SURGERY         Home Medications    Prior to Admission medications   Medication Sig Start Date End Date Taking? Authorizing Provider  albuterol (VENTOLIN HFA) 108 (90 Base) MCG/ACT inhaler Inhale 2 puffs into the lungs every 4 (four) hours as needed for wheezing or shortness of breath. 06/24/19   Zion Lint C, PA-C  clindamycin (CLEOCIN) 150 MG capsule Take 2 capsules (300 mg total) by mouth 3 (three) times daily. May dispense as 150mg  capsules 12/24/18   [name], PA-C  ibuprofen (ADVIL) 800 MG tablet Take 1 tablet (800 mg total) by mouth 3 (three) times daily. Take after course of prednisone 06/24/19   Nelva Hauk C, PA-C  INVEGA SUSTENNA 234 MG/1.5ML SUSY injection Inject 234 mg into the muscle every 28 (twenty-eight) days.  10/13/18    [provider]  predniSONE (DELTASONE) 50 MG tablet Take 1 tablet (50 mg total) by mouth daily for 5 days. 06/24/19 06/29/19  Aron Needles C, PA-C  traZODone (DESYREL) 100 MG tablet Take 100 mg by mouth at bedtime.  11/01/18   [provider]    Family History Family History  Problem Relation Age of Onset  . Diabetes Mother   . Hypertension Mother     Social History Social History   Tobacco Use  . Smoking status: Current Every Day Smoker    Packs/day: 0.50    Types: Cigarettes  . Smokeless tobacco: Never Used  Substance Use Topics  . Alcohol use: Yes    Comment: weekly  . Drug use: Yes    Types: Marijuana, Cocaine    Comment: denies     Allergies   Penicillins   Review of Systems Review of Systems  Constitutional: Negative for activity change, appetite change, chills, fatigue and fever.  HENT: Negative for congestion, ear pain, rhinorrhea, sinus pressure, sore throat and trouble swallowing.   Eyes: Negative for discharge, redness, itching and visual disturbance.  Respiratory: Positive for wheezing. Negative for cough, chest tightness and shortness of breath.   Cardiovascular: Negative for chest pain and leg swelling.  Gastrointestinal: Negative for abdominal pain, diarrhea, nausea and vomiting.  Musculoskeletal: Positive for arthralgias and joint swelling. Negative for myalgias.  Skin:  Negative for color change, rash and wound.  Neurological: Negative for dizziness, syncope, weakness, light-headedness and headaches.     Physical Exam Triage Vital Signs ED Triage Vitals  Enc Vitals Group     BP 06/24/19 1458 115/71     Pulse Rate 06/24/19 1458 81     Resp 06/24/19 1458 16     Temp 06/24/19 1458 98.2 F (36.8 C)     Temp Source 06/24/19 1458 Oral     SpO2 06/24/19 1458 97 %     Weight --      Height --      Head Circumference --      Peak Flow --      Pain Score 06/24/19 1454 10     Pain Loc --      Pain Edu? --      Excl. in GC? --     No data found.  Updated Vital Signs BP 115/71 (BP Location: Right Arm)   Pulse 81   Temp 98.2 F (36.8 C) (Oral)   Resp 16   SpO2 97%   Visual Acuity Right Eye Distance:   Left Eye Distance:   Bilateral Distance:    Right Eye Near:   Left Eye Near:    Bilateral Near:     Physical Exam Vitals and nursing note reviewed.  Constitutional:      Appearance: He is well-developed.     Comments: No acute distress  HENT:     Head: Normocephalic and atraumatic.     Nose: Nose normal.  Eyes:     Conjunctiva/sclera: Conjunctivae normal.  Cardiovascular:     Rate and Rhythm: Normal rate.  Pulmonary:     Effort: Pulmonary effort is normal. No respiratory distress.     Breath sounds: Wheezing present.     Comments: Breathing comfortably at rest, no coughing during visit, coarse respiratory sounds throughout all lung fields, mild expiratory wheezing throughout lungs Abdominal:     General: There is no distension.  Musculoskeletal:        General: Normal range of motion.     Cervical back: Neck supple.     Comments: Right wrist: No obvious swelling or deformity, tenderness to palpation of mid to distal radius and ulna, mild tenderness to palpation or bony prominences of elbow, full active range of motion of elbow, mild tenderness throughout carpals and first through fifth metacarpals.  Full active range of motion of all fingers, able to make fist although does elicit pain in wrist, full thumb opposition to all fingers. Radial pulse 2+, sensation intact distally  Skin:    General: Skin is warm and dry.  Neurological:     Mental Status: He is alert and oriented to person, place, and time.      UC Treatments / Results  Labs (all labs ordered are listed, but only abnormal results are displayed) Labs Reviewed - No data to display  EKG   Radiology DG Forearm Right  Result Date: 06/24/2019 CLINICAL DATA:  Right forearm pain and limited range of motion since last night when  the patient landed on it awkwardly. Initial encounter. EXAM: RIGHT FOREARM - 2 VIEW COMPARISON:  None. FINDINGS: There is no evidence of fracture or other focal bone lesions. Soft tissues are unremarkable. IMPRESSION: Negative exam. Electronically Signed   By: Drusilla Kanner M.D.   On: 06/24/2019 16:07   DG Wrist Complete Right  Result Date: 06/24/2019 CLINICAL DATA:  Right wrist pain and limited range of motion  since last night when the patient landed on it awkwardly. Initial encounter. EXAM: RIGHT WRIST - COMPLETE 3+ VIEW COMPARISON:  None. FINDINGS: There is no evidence of fracture or dislocation. There is no evidence of arthropathy or other focal bone abnormality. Soft tissues are unremarkable. IMPRESSION: Negative exam. Electronically Signed   By: Drusilla Kannerhomas  Dalessio M.D.   On: 06/24/2019 16:08    Procedures Procedures (including critical care time)  Medications Ordered in UC Medications - No data to display  Initial Impression / Assessment and Plan / UC Course  I have reviewed the triage vital signs and the nursing notes.  Pertinent labs & imaging results that were available during my care of the patient were reviewed by me and considered in my medical decision making (see chart for details).     X-rays negative for acute bony normality.  Most likely sprain.  Will place in wrist brace and have him take anti-inflammatories.  Given coarseness and wheezing auscultated on lungs will place on course of prednisone x5 days followed by NSAIDs.  Ice and elevation of rest.  Do not suspect underlying tendon damage at this time.  Refilled inhaler.Discussed strict return precautions. Patient verbalized understanding and is agreeable with plan.  Final Clinical Impressions(s) / UC Diagnoses   Final diagnoses:  Mild intermittent asthma without complication  Medication refill  Right wrist injury, initial encounter     Discharge Instructions     Inhaler refilled Xrays normal Wear wrist brace x  2 weeks Ice and elevate Rest wrist Prednisone daily with food x 5 days, this will help with wrist pain and lung inflammation After finishing prednisone; You may take up to 800 mg Ibuprofen every 8 hours with food. You may supplement Ibuprofen with Tylenol [phone] mg every 8 hours.      ED Prescriptions    Medication Sig Dispense Auth. Provider   albuterol (VENTOLIN HFA) 108 (90 Base) MCG/ACT inhaler Inhale 2 puffs into the lungs every 4 (four) hours as needed for wheezing or shortness of breath. 18 g Jeovani Weisenburger C, PA-C   predniSONE (DELTASONE) 50 MG tablet Take 1 tablet (50 mg total) by mouth daily for 5 days. 5 tablet Maudy Yonan C, PA-C   ibuprofen (ADVIL) 800 MG tablet Take 1 tablet (800 mg total) by mouth 3 (three) times daily. Take after course of prednisone 21 tablet Albie Arizpe C, PA-C     PDMP not reviewed this encounter.   Lew DawesWieters, Chistine Dematteo C, [place]PA-C 06/24/19 1615

## 2019-06-24 NOTE — Discharge Instructions (Addendum)
Inhaler refilled Xrays normal Wear wrist brace x 2 weeks Ice and elevate Rest wrist Prednisone daily with food x 5 days, this will help with wrist pain and lung inflammation After finishing prednisone; You may take up to 800 mg Ibuprofen every 8 hours with food. You may supplement Ibuprofen with Tylenol [phone] mg every 8 hours.

## 2019-09-27 IMAGING — CT CT RENAL STONE PROTOCOL
2 of 4 series · 16 of 46 positions shown, 18 images · non-contrast
Comparison: None.

CLINICAL DATA: Left flank pain and umbilical pain

EXAM:
CT ABDOMEN AND PELVIS WITHOUT CONTRAST
TECHNIQUE: Multidetector CT imaging of the abdomen and pelvis was performed
following the standard protocol without IV contrast.

[Series 2: axial st · axial · 0.64mm/px · z∈[-386,-16]mm · 13 of 82 slices shown, 15 images]
[im 4/82  soft-tissue]
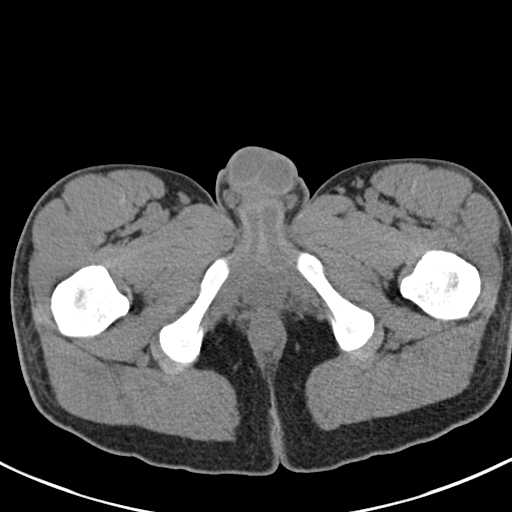
[im 4/82  bone]
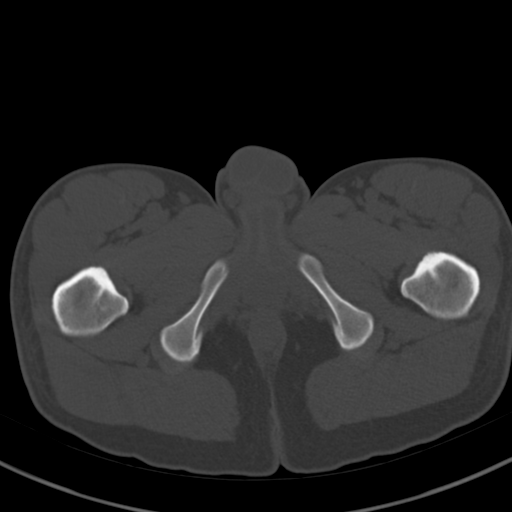
[im 12/82  soft-tissue]
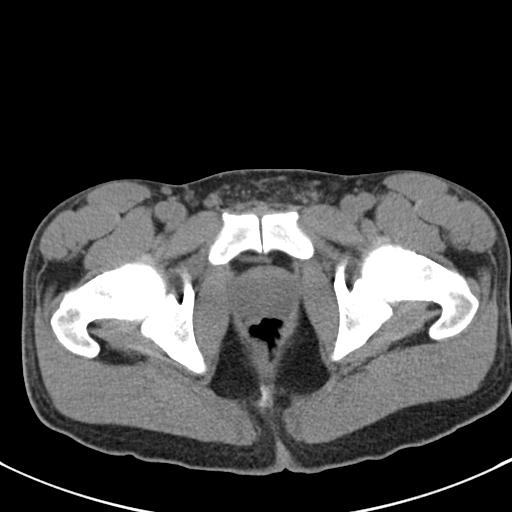
[im 16/82  soft-tissue]
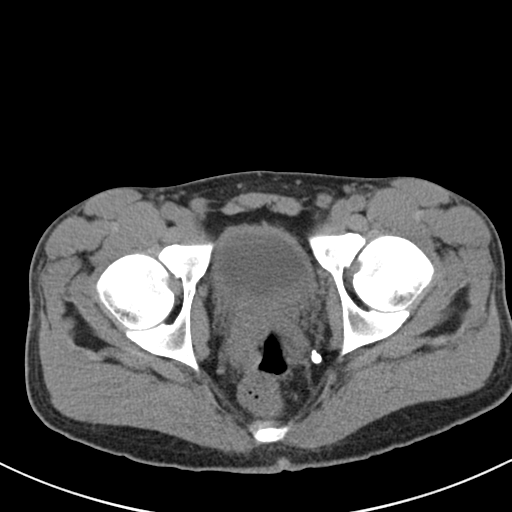
[im 24/82  soft-tissue]
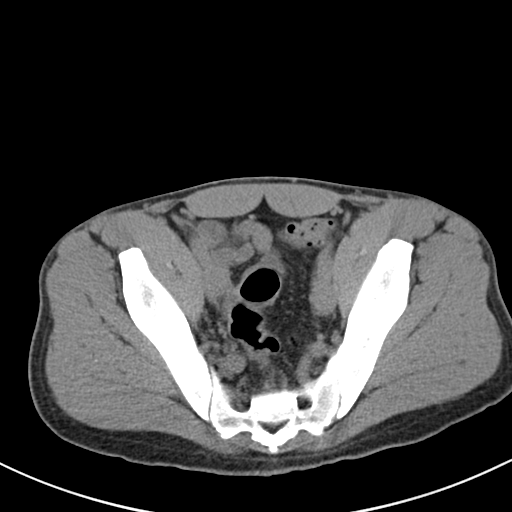
[im 28/82  soft-tissue]
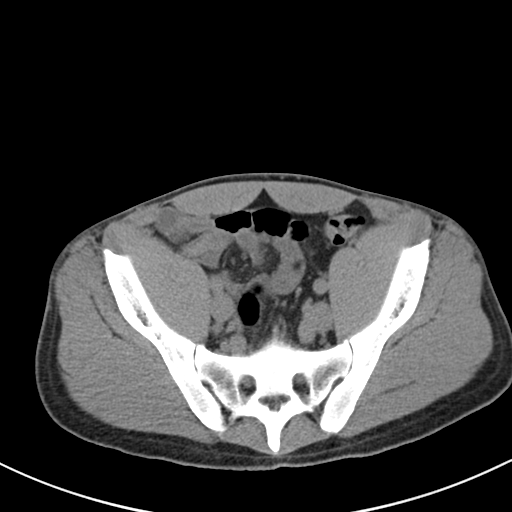
[im 35/82  soft-tissue]
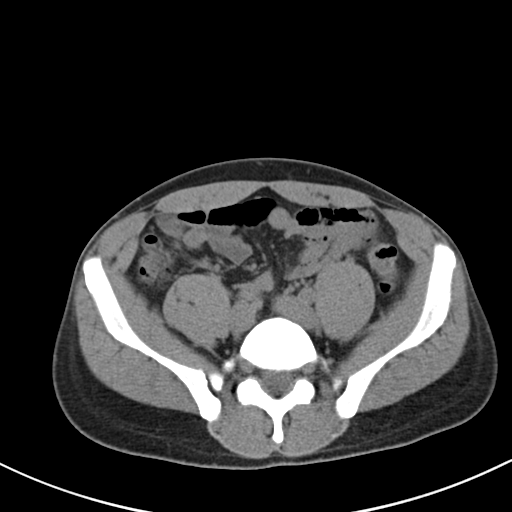
[im 43/82  soft-tissue]
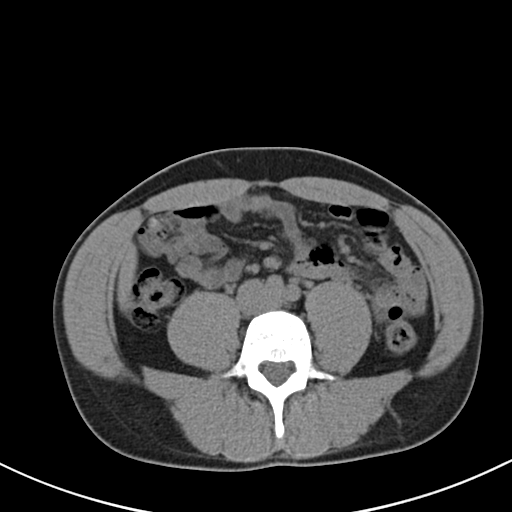
[im 47/82  soft-tissue]
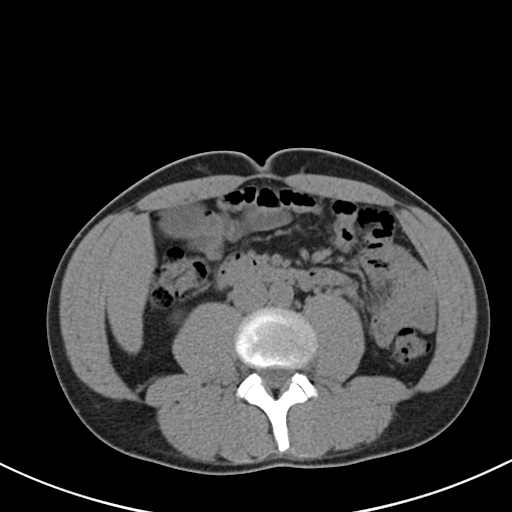
[im 55/82  soft-tissue]
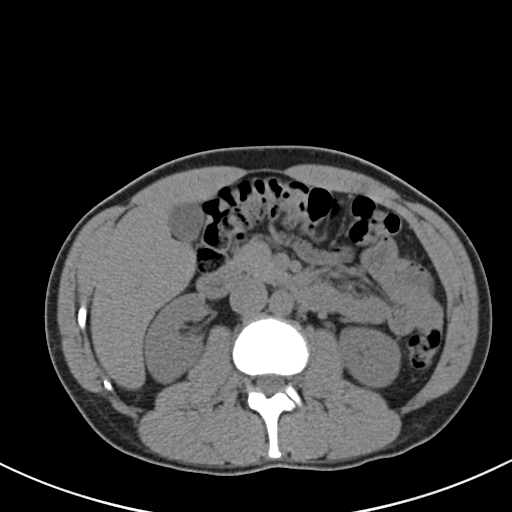
[im 55/82  bone]
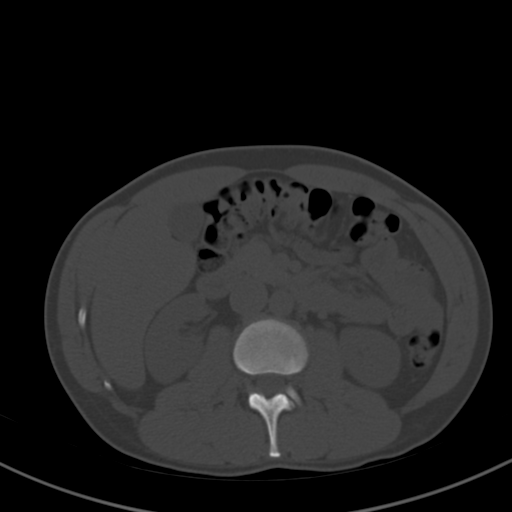
[im 58/82  soft-tissue]
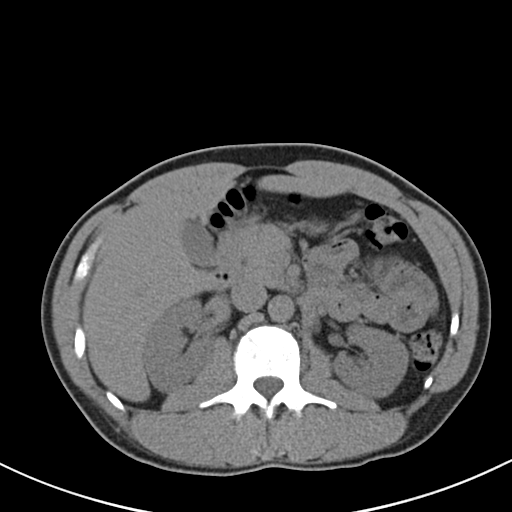
[im 66/82  soft-tissue]
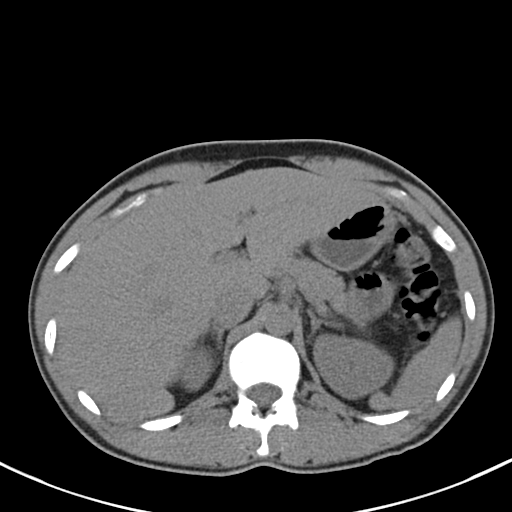
[im 70/82  soft-tissue]
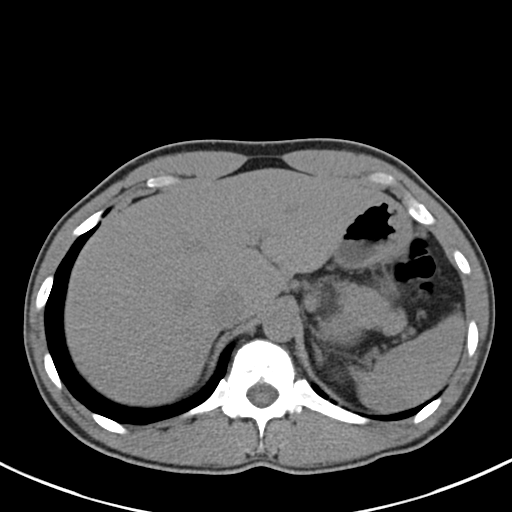
[im 78/82  soft-tissue]
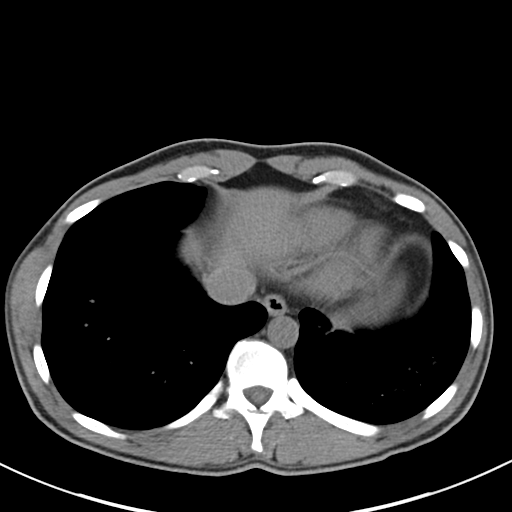

[Series 4: coronal · coronal · 0.58mm/px · 3 of 103 slices shown]
[im 35/103  soft-tissue]
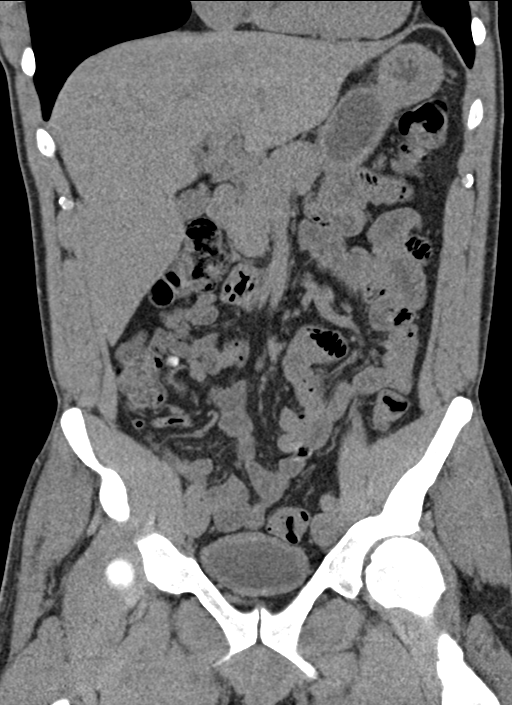
[im 46/103  soft-tissue]
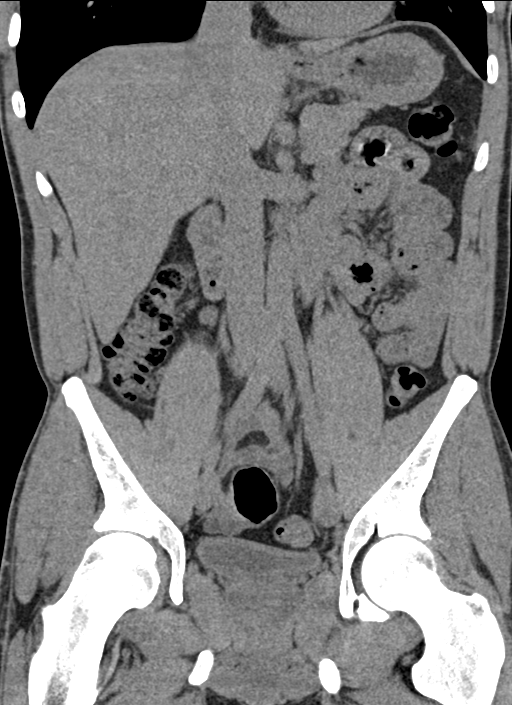
[im 57/103  soft-tissue]
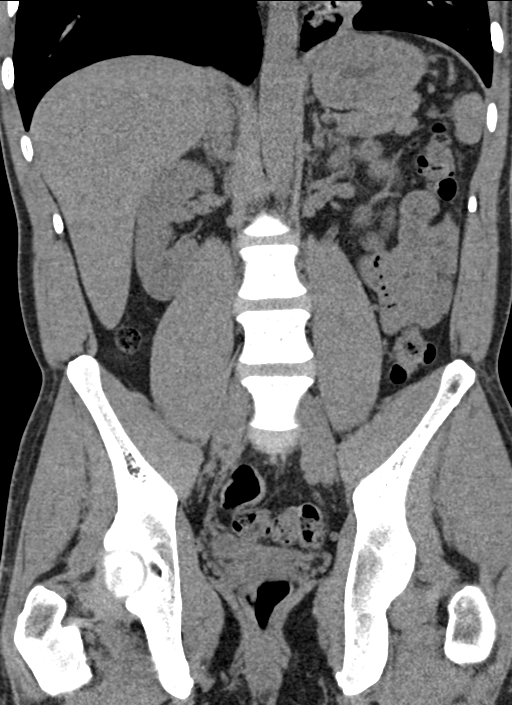

[16 of 46 positions shown; findings below may reference images not displayed]

FINDINGS: Lower chest: Lung bases demonstrate normal heart size. Ground-glass
densities and small nodules in the left lower lobe with small focal
area of consolidation in the medial left lung base. No pleural
effusion.

Hepatobiliary: No focal liver abnormality is seen. No gallstones,
gallbladder wall thickening, or biliary dilatation.

Pancreas: Unremarkable. No pancreatic ductal dilatation or
surrounding inflammatory changes.

Spleen: Normal in size without focal abnormality.

Adrenals/Urinary Tract: Adrenal glands are unremarkable. Kidneys are
normal, without renal calculi, focal lesion, or hydronephrosis.
Bladder is unremarkable.

Stomach/Bowel: Stomach is within normal limits. Appendix appears
normal. No evidence of bowel wall thickening, distention, or
inflammatory changes.

Vascular/Lymphatic: No significant vascular findings are present. No
enlarged abdominal or pelvic lymph nodes.

Reproductive: Prostate is unremarkable.

Other: No abdominal wall hernia or abnormality. No abdominopelvic
ascites.

Musculoskeletal: No acute or suspicious bone lesions.
IMPRESSION: 1. Negative for hydronephrosis, nephrolithiasis or ureteral stone
2. Ground-glass and nodular densities in the left lower lobe with
small focal area of consolidation in the medial left lung base,
findings would be consistent with respiratory infection/pneumonia.
Given appearance, could consider atypical pneumonia.

## 2019-11-03 ENCOUNTER — Emergency Department (HOSPITAL_COMMUNITY)
Admission: EM | Admit: 2019-11-03 | Discharge: 2019-11-03 | Disposition: A | Discharge status: Home / Self Care | Payer: Medicare Other | Attending: Emergency Medicine | Admitting: Emergency Medicine

## 2019-11-03 ENCOUNTER — Encounter (HOSPITAL_COMMUNITY): Payer: Self-pay | Admitting: *Deleted

## 2019-11-03 ENCOUNTER — Emergency Department (HOSPITAL_COMMUNITY): Payer: Medicare Other

## 2019-11-03 ENCOUNTER — Other Ambulatory Visit: Payer: Self-pay

## 2019-11-03 DIAGNOSIS — M545 Low back pain: Secondary | ICD-10-CM | POA: Insufficient documentation

## 2019-11-03 DIAGNOSIS — Z5321 Procedure and treatment not carried out due to patient leaving prior to being seen by health care provider: Secondary | ICD-10-CM | POA: Insufficient documentation

## 2019-11-03 NOTE — ED Triage Notes (Signed)
Pt says he was at work and heard a "pop" in his back, c/o lower back pain since yesterday at work. No numbness or tingling.

## 2019-11-03 NOTE — ED Notes (Signed)
This RN called for this pt several times and searched triage and the [facility] BR for this pt. No response and pt will be discharged LWBS

## 2019-11-03 NOTE — ED Notes (Signed)
Call pt 3 times no response  

## 2019-11-08 ENCOUNTER — Other Ambulatory Visit: Payer: Self-pay

## 2019-11-08 ENCOUNTER — Emergency Department (HOSPITAL_COMMUNITY)
Admission: EM | Admit: 2019-11-08 | Discharge: 2019-11-08 | Disposition: A | Discharge status: Home / Self Care | Payer: Medicare Other | Attending: Emergency Medicine | Admitting: Emergency Medicine

## 2019-11-08 ENCOUNTER — Encounter (HOSPITAL_COMMUNITY): Payer: Self-pay | Admitting: Emergency Medicine

## 2019-11-08 DIAGNOSIS — F17210 Nicotine dependence, cigarettes, uncomplicated: Secondary | ICD-10-CM | POA: Insufficient documentation

## 2019-11-08 DIAGNOSIS — M545 Low back pain, unspecified: Secondary | ICD-10-CM

## 2019-11-08 DIAGNOSIS — M546 Pain in thoracic spine: Secondary | ICD-10-CM | POA: Insufficient documentation

## 2019-11-08 DIAGNOSIS — F319 Bipolar disorder, unspecified: Secondary | ICD-10-CM | POA: Insufficient documentation

## 2019-11-08 DIAGNOSIS — Z8709 Personal history of other diseases of the respiratory system: Secondary | ICD-10-CM | POA: Insufficient documentation

## 2019-11-08 DIAGNOSIS — Z79899 Other long term (current) drug therapy: Secondary | ICD-10-CM | POA: Insufficient documentation

## 2019-11-08 DIAGNOSIS — M549 Dorsalgia, unspecified: Secondary | ICD-10-CM

## 2019-11-08 MED ORDER — METHOCARBAMOL 750 MG PO TABS: 750.0000 mg | ORAL_TABLET | Freq: Two times a day (BID) | ORAL | 0 refills | Status: DC | PRN

## 2019-11-08 MED ORDER — LIDOCAINE 5 % EX PTCH: 1.0000 | MEDICATED_PATCH | CUTANEOUS | 0 refills | Status: DC

## 2019-11-08 NOTE — Discharge Instructions (Signed)
  Take it easy, but do not lay around too much as this may make any stiffness worse.  Antiinflammatory medications: Take 600 mg of ibuprofen every 6 hours or 440 mg (over the counter dose) to 500 mg (prescription dose) of naproxen every 12 hours for the next 3 days. After this time, these medications may be used as needed for pain. Take these medications with food to avoid upset stomach. Choose only one of these medications, do not take them together. Acetaminophen (generic for Tylenol): Should you continue to have additional pain while taking the ibuprofen or naproxen, you may add in acetaminophen as needed. Your daily total maximum amount of acetaminophen from all sources should be limited to 4000mg/day for persons without liver problems, or 2000mg/day for those with liver problems. Methocarbamol: Methocarbamol (generic for Robaxin) is a muscle relaxer and can help relieve stiff muscles or muscle spasms.  Do not drive or perform other dangerous activities while taking this medication as it can cause drowsiness as well as changes in reaction time and judgement. Lidocaine patches: These are available via either prescription or over-the-counter. The over-the-counter option may be more economical one and are likely just as effective. There are multiple over-the-counter brands, such as Salonpas. Ice: May apply ice to the area over the next 24 hours for 15 minutes at a time to reduce pain, inflammation, and swelling, if present. Exercises: Be sure to perform the attached exercises starting with three times a week and working up to performing them daily. This is an essential part of preventing long term problems.  Follow up: Follow up with a primary care provider for any future management of these complaints. Be sure to follow up within 7-10 days. Return: Return to the ED should symptoms worsen.  For prescription assistance, may try using prescription discount sites or apps, such as goodrx.com 

## 2019-11-08 NOTE — ED Provider Notes (Signed)
Gene Rivera [facility] EMERGENCY DEPARTMENT Provider Note   CSN: [id number] Arrival date & time: 11/08/19  1617     History Chief Complaint  Patient presents with  . Back Pain    Gene Rivera is a 39 y.o. male.  HPI    Gene Rivera is a 39 y.o. male, with a history of asthma and bipolar, presenting to the ED primarily complaining of back pain beginning Wednesday, April 21. Patient states is part of his job he does a lot of bending, lifting, and twisting.  He was doing these actions when he began to feel tightness and then a twinging pain in the lower back. He now complains of pain that he describes as a soreness and tightness throughout the lower back, left upper back, and left neck. He came to the ED last week, received lumbar spine x-rays, but then left without being seen.  He has put ice on the painful areas, but has not tried any other therapies.  Denies weakness, numbness, chest pain, shortness of breath, urinary symptoms, changes in bowel or bladder function, saddle anesthesias, falls/trauma, or any other complaints.     Past Medical History:  Diagnosis Date  . Asthma   . Bipolar affective disorder (HCC)     There are no problems to display for this patient.   Past Surgical History:  Procedure Laterality Date  . LOBECTOMY Left 1990  . LUNG SURGERY         Family History  Problem Relation Age of Onset  . Diabetes Mother   . Hypertension Mother     Social History   Tobacco Use  . Smoking status: Current Every Day Smoker    Packs/day: 0.50    Types: Cigarettes  . Smokeless tobacco: Never Used  Substance Use Topics  . Alcohol use: Yes    Comment: weekly  . Drug use: Yes    Types: Marijuana, Cocaine    Comment: denies    Home Medications Prior to Admission medications   Medication Sig Start Date End Date Taking? Authorizing Provider  albuterol (VENTOLIN HFA) 108 (90 Base) MCG/ACT inhaler Inhale 2 puffs into the lungs every 4 (four)  hours as needed for wheezing or shortness of breath. 06/24/19   Wieters, Hallie C, PA-C  clindamycin (CLEOCIN) 150 MG capsule Take 2 capsules (300 mg total) by mouth 3 (three) times daily. May dispense as 150mg  capsules 12/24/18   02/23/19, PA-C  ibuprofen (ADVIL) 800 MG tablet Take 1 tablet (800 mg total) by mouth 3 (three) times daily. Take after course of prednisone 06/24/19   Wieters, Hallie C, PA-C  INVEGA SUSTENNA 234 MG/1.5ML SUSY injection Inject 234 mg into the muscle every 28 (twenty-eight) days.  10/13/18   [provider]  lidocaine (LIDODERM) 5 % Place 1 patch onto the skin daily. Remove & Discard patch within 12 hours or as directed by MD 11/08/19   Leonila Speranza, 11/10/19, PA-C  methocarbamol (ROBAXIN) 750 MG tablet Take 1 tablet (750 mg total) by mouth 2 (two) times daily as needed for muscle spasms (or muscle tightness). 11/08/19   Chaunice Obie C, PA-C  traZODone (DESYREL) 100 MG tablet Take 100 mg by mouth at bedtime.  11/01/18   [provider]    Allergies    Penicillins  Review of Systems   Review of Systems  Respiratory: Negative for shortness of breath.   Cardiovascular: Negative for chest pain.  Gastrointestinal: Negative for abdominal pain, nausea and vomiting.  Genitourinary: Negative  for difficulty urinating and dysuria.  Musculoskeletal: Positive for back pain.  Neurological: Negative for syncope, weakness and numbness.  All other systems reviewed and are negative.   Physical Exam Updated Vital Signs BP 119/76 (BP Location: Left Arm)   Pulse 78   Temp 98.9 F (37.2 C) (Oral)   Resp 18   Ht 5\' 9"  (1.753 m)   Wt 68.9 kg   SpO2 100%   BMI 22.45 kg/m   Physical Exam Vitals and nursing note reviewed.  Constitutional:      General: He is not in acute distress.    Appearance: He is well-developed. He is not diaphoretic.  HENT:     Head: Normocephalic and atraumatic.     Mouth/Throat:     Mouth: Mucous membranes are moist.     Pharynx:  Oropharynx is clear.  Eyes:     Conjunctiva/sclera: Conjunctivae normal.  Cardiovascular:     Rate and Rhythm: Normal rate and regular rhythm.     Pulses: Normal pulses.          Radial pulses are 2+ on the right side and 2+ on the left side.       Posterior tibial pulses are 2+ on the right side and 2+ on the left side.     Comments: Tactile temperature in the extremities appropriate and equal bilaterally. Pulmonary:     Effort: Pulmonary effort is normal. No respiratory distress.  Abdominal:     Palpations: Abdomen is soft.     Tenderness: There is no abdominal tenderness. There is no guarding.  Musculoskeletal:     Cervical back: Neck supple.       Back:     Right lower leg: No edema.     Left lower leg: No edema.     Comments: Tenderness to the lumbar and upper back musculature, as shown.  Tenderness to the left trapezius. Full range of motion in the neck and upper extremities. No deformity, swelling, color change, or other abnormality besides tenderness noted to the back, neck, or shoulders.  Lymphadenopathy:     Cervical: No cervical adenopathy.  Skin:    General: Skin is warm and dry.  Neurological:     Mental Status: He is alert.     Comments: No noted acute cognitive deficit. Sensation grossly intact to light touch in the extremities.   Grip strengths equal bilaterally.   Strength 5/5 in all extremities.  No gait disturbance.  Coordination intact.  Cranial nerves III-XII grossly intact.  Handles oral secretions without noted difficulty.  No noted phonation or speech deficit.  Psychiatric:        Mood and Affect: Mood and affect normal.        Speech: Speech normal.        Behavior: Behavior normal.     ED Results / Procedures / Treatments   Labs (all labs ordered are listed, but only abnormal results are displayed) Labs Reviewed - No data to display  EKG None  Radiology No results found.   DG Lumbar Spine Complete  Result Date: 11/03/2019 CLINICAL  DATA:  Acute onset of lumbosacral back pain after hearing a pop. EXAM: LUMBAR SPINE - COMPLETE 4+ VIEW COMPARISON:  Report from lumbar spine radiograph 03/30/2013 FINDINGS: The alignment is maintained. Vertebral body heights are normal. There is no listhesis. The posterior elements are intact. Disc spaces are preserved. No fracture. Sacroiliac joints are symmetric and normal. IMPRESSION: Negative radiographs of the lumbar spine. Electronically Signed   By:  Keith Rake M.D.   On: 11/03/2019 22:49    Procedures Procedures (including critical care time)  Medications Ordered in ED Medications - No data to display  ED Course  I have reviewed the triage vital signs and the nursing notes.  Pertinent labs & imaging results that were available during my care of the patient were reviewed by me and considered in my medical decision making (see chart for details).    MDM Rules/Calculators/A&P                      Patient presents with atraumatic back pain since last week.  He has a possible explanation for onset of pain with muscular origin.  No acute abnormalities noted on lumbar spine x-rays taken last week.  These were reviewed and interpreted by me.  The patient was given instructions for home care as well as return precautions. Patient voices understanding of these instructions, accepts the plan, and is comfortable with discharge.   Final Clinical Impression(s) / ED Diagnoses Final diagnoses:  Acute bilateral low back pain without sciatica  Upper back pain on left side    Rx / DC Orders ED Discharge Orders         Ordered    methocarbamol (ROBAXIN) 750 MG tablet  2 times daily PRN     11/08/19 1806    lidocaine (LIDODERM) 5 %  Every 24 hours     11/08/19 1806           Layla Maw 11/08/19 1825    Margette Fast, MD 11/09/19 [phone]

## 2019-11-08 NOTE — ED Triage Notes (Signed)
Pt endorses lower back pain and left shoulder pain. States it is work related. Seen here last week but LWBS and had xrays done.

## 2019-11-10 ENCOUNTER — Telehealth: Payer: Self-pay | Admitting: *Deleted

## 2019-11-10 NOTE — Telephone Encounter (Signed)
Pt called regarding prior authorization for 5% lidocaine patches.  RNCM consulted EDP and advised pt to purchase 4% lidocaine patches OTC.

## 2019-12-29 ENCOUNTER — Telehealth (HOSPITAL_COMMUNITY): Payer: Self-pay

## 2019-12-29 ENCOUNTER — Encounter (HOSPITAL_COMMUNITY): Payer: Self-pay | Admitting: Emergency Medicine

## 2019-12-29 ENCOUNTER — Other Ambulatory Visit: Payer: Self-pay

## 2019-12-29 ENCOUNTER — Ambulatory Visit (HOSPITAL_COMMUNITY)
Admission: EM | Admit: 2019-12-29 | Discharge: 2019-12-29 | Disposition: A | Discharge status: Home / Self Care | Payer: Medicare Other | Attending: Urgent Care | Admitting: Urgent Care

## 2019-12-29 DIAGNOSIS — L02411 Cutaneous abscess of right axilla: Secondary | ICD-10-CM | POA: Diagnosis not present

## 2019-12-29 DIAGNOSIS — M79621 Pain in right upper arm: Secondary | ICD-10-CM

## 2019-12-29 DIAGNOSIS — R58 Hemorrhage, not elsewhere classified: Secondary | ICD-10-CM

## 2019-12-29 MED ORDER — SULFAMETHOXAZOLE-TRIMETHOPRIM 800-160 MG PO TABS: 1.0000 | ORAL_TABLET | Freq: Two times a day (BID) | ORAL | 0 refills | Status: DC

## 2019-12-29 MED ORDER — NAPROXEN 500 MG PO TABS: 500.0000 mg | ORAL_TABLET | Freq: Two times a day (BID) | ORAL | 0 refills | Status: DC

## 2019-12-29 NOTE — ED Provider Notes (Signed)
[facility]   MRN: [id number] DOB: 1980/12/04  Subjective:   Gene Rivera is a 39 y.o. male presenting for 3-day history of persistent right axillary pain, drainage, swelling and tenderness.  Patient has had an abscess there before.  He also donated plasma and has bruising on the right forearm.  Had some significant swelling yesterday but has improved dramatically today.  Denies fever, nausea, vomiting, belly pain, active arm swelling, redness about his forearm.  No current facility-administered medications for this encounter.  Current Outpatient Medications:  .  albuterol (VENTOLIN HFA) 108 (90 Base) MCG/ACT inhaler, Inhale 2 puffs into the lungs every 4 (four) hours as needed for wheezing or shortness of breath., Disp: 18 g, Rfl: 0 .  clindamycin (CLEOCIN) 150 MG capsule, Take 2 capsules (300 mg total) by mouth 3 (three) times daily. May dispense as 150mg  capsules, Disp: 60 capsule, Rfl: 0 .  ibuprofen (ADVIL) 800 MG tablet, Take 1 tablet (800 mg total) by mouth 3 (three) times daily. Take after course of prednisone, Disp: 21 tablet, Rfl: 0 .  INVEGA SUSTENNA 234 MG/1.5ML SUSY injection, Inject 234 mg into the muscle every 28 (twenty-eight) days. , Disp: , Rfl:  .  lidocaine (LIDODERM) 5 %, Place 1 patch onto the skin daily. Remove & Discard patch within 12 hours or as directed by MD, Disp: 30 patch, Rfl: 0 .  methocarbamol (ROBAXIN) 750 MG tablet, Take 1 tablet (750 mg total) by mouth 2 (two) times daily as needed for muscle spasms (or muscle tightness)., Disp: 20 tablet, Rfl: 0 .  traZODone (DESYREL) 100 MG tablet, Take 100 mg by mouth at bedtime. , Disp: , Rfl:    Allergies  Allergen Reactions  . Penicillins Hives, Itching and Other (See Comments)    Has patient had a PCN reaction causing immediate rash, facial/tongue/throat swelling, SOB or lightheadedness with hypotension: yes Has patient had a PCN reaction causing severe rash involving mucus membranes or skin necrosis:  no Has patient had a PCN reaction that required hospitalization: yes Has patient had a PCN reaction occurring within the last 10 years: no If all of the above answers are "NO", then may proceed with Cephalosporin use.     Past Medical History:  Diagnosis Date  . Asthma   . Bipolar affective disorder [facility])      Past Surgical History:  Procedure Laterality Date  . LOBECTOMY Left 1990  . LUNG SURGERY      Family History  Problem Relation Age of Onset  . Diabetes Mother   . Hypertension Mother     Social History   Tobacco Use  . Smoking status: Current Every Day Smoker    Packs/day: 0.50    Types: Cigarettes  . Smokeless tobacco: Never Used  Vaping Use  . Vaping Use: Never used  Substance Use Topics  . Alcohol use: Yes    Comment: weekly  . Drug use: Yes    Types: Marijuana, Cocaine    Comment: denies    ROS   Objective:   Vitals: BP 118/69 (BP Location: Left Arm)   Pulse 93   Temp 98.5 F (36.9 C) (Oral)   Resp 14   SpO2 98%   Physical Exam Constitutional:      General: He is not in acute distress.    Appearance: Normal appearance. He is well-developed and normal weight. He is not ill-appearing, toxic-appearing or diaphoretic.  HENT:     Head: Normocephalic and atraumatic.     Right Ear:  External ear normal.     Left Ear: External ear normal.     Nose: Nose normal.     Mouth/Throat:     Pharynx: Oropharynx is clear.  Eyes:     General: No scleral icterus.       Right eye: No discharge.        Left eye: No discharge.     Extraocular Movements: Extraocular movements intact.     Pupils: Pupils are equal, round, and reactive to light.  Cardiovascular:     Rate and Rhythm: Normal rate.  Pulmonary:     Effort: Pulmonary effort is normal.  Musculoskeletal:     Cervical back: Normal range of motion.  Skin:      Neurological:     Mental Status: He is alert and oriented to person, place, and time.  Psychiatric:        Mood and Affect: Mood  normal.        Behavior: Behavior normal.        Thought Content: Thought content normal.        Judgment: Judgment normal.      Assessment and Plan :   PDMP not reviewed this encounter.  1. Abscess of axilla, right   2. Pain in right axilla   3. Ecchymosis of forearm     Wound is open, no need for I&D today.  Recommend starting Bactrim, Naprosyn for pain and inflammation.  Wound care reviewed.  Anticipatory guidance provided for ecchymosis related to his plasma donation from yesterday. Counseled patient on potential for adverse effects with medications prescribed/recommended today, ER and return-to-clinic precautions discussed, patient verbalized understanding.    Jaynee Eagles, PA-C 12/29/19 1413

## 2019-12-29 NOTE — Discharge Instructions (Addendum)
Bactrim is for the infection. Keep your wound covered. Apply warm compresses to get the drainage out. Change your dressing 2-3 times daily. Do not apply any ointments or creams. Each time you change your dressing, make sure you clean gently around the perimeter of the wound with gentle soap and warm water. Pat your wound dry before reapplying another dressing. Naproxen is for pain and inflammation.

## 2019-12-29 NOTE — ED Triage Notes (Signed)
Pt c/o abscess in right axilla x 3 days. Pt states it is not painful just irritated. Pt states he has had an abscess there before a few years ago. Pt also c/o bruise on right forearm on set Tuesday after donating plasma.

## 2020-02-02 DIAGNOSIS — R0602 Shortness of breath: Secondary | ICD-10-CM | POA: Diagnosis not present

## 2020-02-02 DIAGNOSIS — R05 Cough: Secondary | ICD-10-CM | POA: Diagnosis not present

## 2020-03-06 ENCOUNTER — Other Ambulatory Visit: Payer: Self-pay

## 2020-03-06 ENCOUNTER — Ambulatory Visit (HOSPITAL_COMMUNITY)
Admission: EM | Admit: 2020-03-06 | Discharge: 2020-03-06 | Disposition: A | Discharge status: Home / Self Care | Payer: Medicare Other | Attending: Physician Assistant | Admitting: Physician Assistant

## 2020-03-06 ENCOUNTER — Encounter (HOSPITAL_COMMUNITY): Payer: Self-pay

## 2020-03-06 DIAGNOSIS — K029 Dental caries, unspecified: Secondary | ICD-10-CM

## 2020-03-06 DIAGNOSIS — K047 Periapical abscess without sinus: Secondary | ICD-10-CM

## 2020-03-06 MED ORDER — ACETAMINOPHEN 325 MG PO TABS: 650.0000 mg | ORAL_TABLET | Freq: Four times a day (QID) | ORAL | 0 refills | Status: DC | PRN

## 2020-03-06 MED ORDER — IBUPROFEN 800 MG PO TABS: 800.0000 mg | ORAL_TABLET | Freq: Three times a day (TID) | ORAL | 0 refills | Status: DC

## 2020-03-06 MED ORDER — CLINDAMYCIN HCL 150 MG PO CAPS: 300.0000 mg | ORAL_CAPSULE | Freq: Three times a day (TID) | ORAL | 0 refills | Status: DC

## 2020-03-06 NOTE — Discharge Instructions (Signed)
Take medications as prescribed  Establish care with the internal medicine center for primary care  Call a dentist  [facility] dental Address: [address] [place], [place], [place] [address] Hours:  Closed ? [place] Wed Phone: [phone]  If swelling significantly worsens, high fever, neck swelling, return or go to the emergency department

## 2020-03-06 NOTE — ED Provider Notes (Signed)
[facility]    CSN: [id number] Arrival date & time: 03/06/20  1931      History   Chief Complaint Chief Complaint  Patient presents with  . Dental Pain    HPI Gene Rivera is a 39 y.o. male.   Patient with known history of dental infections presents for pain and infection of his upper front teeth. Reports he has had some swelling of his lips since pain develop. Reports this all developed yesterday evening through today. He reports pain when chewing. Denies difficulty swallowing however. No fevers or chills. Reports having similar issues in the past. Does not have a dentist.     Past Medical History:  Diagnosis Date  . Asthma   . Bipolar affective disorder (HCC)     There are no problems to display for this patient.   Past Surgical History:  Procedure Laterality Date  . LOBECTOMY Left 1990  . LUNG SURGERY         Home Medications    Prior to Admission medications   Medication Sig Start Date End Date Taking? Authorizing Provider  acetaminophen (TYLENOL) 325 MG tablet Take 2 tablets (650 mg total) by mouth every 6 (six) hours as needed. 03/06/20   Simaya Lumadue, Veryl Speak, PA-C  albuterol (VENTOLIN HFA) 108 (90 Base) MCG/ACT inhaler Inhale 2 puffs into the lungs every 4 (four) hours as needed for wheezing or shortness of breath. 06/24/19   Wieters, Hallie C, PA-C  clindamycin (CLEOCIN) 150 MG capsule Take 2 capsules (300 mg total) by mouth 3 (three) times daily. May dispense as 150mg  capsules 03/06/20   Synda Bagent, 03/08/20, PA-C  ibuprofen (ADVIL) 800 MG tablet Take 1 tablet (800 mg total) by mouth 3 (three) times daily. Take after course of prednisone 03/06/20   Arnella Pralle, 03/08/20, PA-C  INVEGA SUSTENNA 234 MG/1.5ML SUSY injection Inject 234 mg into the muscle every 28 (twenty-eight) days.  10/13/18   [provider]  lidocaine (LIDODERM) 5 % Place 1 patch onto the skin daily. Remove & Discard patch within 12 hours or as directed by MD 11/08/19   Joy, 11/10/19, PA-C    methocarbamol (ROBAXIN) 750 MG tablet Take 1 tablet (750 mg total) by mouth 2 (two) times daily as needed for muscle spasms (or muscle tightness). 11/08/19   Joy, Shawn C, PA-C  naproxen (NAPROSYN) 500 MG tablet Take 1 tablet (500 mg total) by mouth 2 (two) times daily with a meal. 12/29/19   12/31/19, PA-C  sulfamethoxazole-trimethoprim (BACTRIM DS) 800-160 MG tablet Take 1 tablet by mouth 2 (two) times daily. 12/29/19   12/31/19, PA-C  traZODone (DESYREL) 100 MG tablet Take 100 mg by mouth at bedtime.  11/01/18   [provider]    Family History Family History  Problem Relation Age of Onset  . Diabetes Mother   . Hypertension Mother     Social History Social History   Tobacco Use  . Smoking status: Current Every Day Smoker    Packs/day: 0.50    Types: Cigarettes  . Smokeless tobacco: Never Used  Vaping Use  . Vaping Use: Never used  Substance Use Topics  . Alcohol use: Yes    Alcohol/week: 4.0 standard drinks    Types: 4 Cans of beer per week  . Drug use: Yes    Types: Marijuana, Cocaine    Comment: denies     Allergies   Penicillins   Review of Systems Review of Systems   Physical Exam Triage  Vital Signs ED Triage Vitals [03/06/20 2046]  Enc Vitals Group     BP 126/86     Pulse Rate 60     Resp 16     Temp 98.9 F (37.2 C)     Temp src      SpO2 100 %     Weight 150 lb (68 kg)     Height 5\' 9"  (1.753 m)     Head Circumference      Peak Flow      Pain Score 10     Pain Loc      Pain Edu?      Excl. in GC?    No data found.  Updated Vital Signs BP 126/86   Pulse 60   Temp 98.9 F (37.2 C)   Resp 16   Ht 5\' 9"  (1.753 m)   Wt 150 lb (68 kg)   SpO2 100%   BMI 22.15 kg/m   Visual Acuity Right Eye Distance:   Left Eye Distance:   Bilateral Distance:    Right Eye Near:   Left Eye Near:    Bilateral Near:     Physical Exam Vitals and nursing note reviewed.  Constitutional:      Appearance: He is well-developed.  HENT:      Head: Normocephalic and atraumatic.     Mouth/Throat:     Comments: Dentition overall very poor with numerous broken and decaying teeth. Axillary incisor is broken with evidence of gingival swelling and buccal swelling. There is swelling of the upper lip. Tenderness to palpation of the upper lip.  Oropharynx clear. Controlling secretions. No trismus. No submandibular or submental swelling. Eyes:     Conjunctiva/sclera: Conjunctivae normal.  Cardiovascular:     Rate and Rhythm: Normal rate.  Pulmonary:     Effort: Pulmonary effort is normal. No respiratory distress.  Abdominal:     Palpations: Abdomen is soft.  Musculoskeletal:     Cervical back: Neck supple.  Skin:    General: Skin is warm and dry.  Neurological:     Mental Status: He is alert.      UC Treatments / Results  Labs (all labs ordered are listed, but only abnormal results are displayed) Labs Reviewed - No data to display  EKG   Radiology No results found.  Procedures Procedures (including critical care time)  Medications Ordered in UC Medications - No data to display  Initial Impression / Assessment and Plan / UC Course  I have reviewed the triage vital signs and the nursing notes.  Pertinent labs & imaging results that were available during my care of the patient were reviewed by me and considered in my medical decision making (see chart for details).     #Dental caries #Dental infection Patient is a 39 year old presenting with dental infection. Penicillin allergy, will place on clindamycin. Ibuprofen and Tylenol for pain. Instructed patient he needs to follow-up with a dentist, resources given. Primary care resource given. Emergency department and return precautions discussed. Patient verbalized understanding plan of care. Final Clinical Impressions(s) / UC Diagnoses   Final diagnoses:  Dental caries  Dental infection     Discharge Instructions     Take medications as prescribed  Establish  care with the internal medicine center for primary care  Call a dentist  [facility] dental Address: [address] [place], [place], [place] Hours:  Closed ? [place] Wed Phone: [phone]  If swelling significantly worsens, high fever, neck swelling, return or  go to the emergency department      ED Prescriptions    Medication Sig Dispense Auth. Provider   clindamycin (CLEOCIN) 150 MG capsule Take 2 capsules (300 mg total) by mouth 3 (three) times daily. May dispense as 150mg  capsules 60 capsule Rokhaya Quinn, , PA-C   ibuprofen (ADVIL) 800 MG tablet Take 1 tablet (800 mg total) by mouth 3 (three) times daily. Take after course of prednisone 21 tablet Kennedy Bohanon, Veryl Speak, PA-C   acetaminophen (TYLENOL) 325 MG tablet Take 2 tablets (650 mg total) by mouth every 6 (six) hours as needed. 30 tablet Jermaine Neuharth, Veryl Speak, PA-C     PDMP not reviewed this encounter.   Veryl Speak, PA-C 03/06/20 2127

## 2020-03-06 NOTE — ED Triage Notes (Signed)
Pt c/o upper front gum pain started today. Pt states it is painful to chew.

## 2020-07-14 DIAGNOSIS — U071 COVID-19: Secondary | ICD-10-CM | POA: Diagnosis not present

## 2020-07-14 DIAGNOSIS — R42 Dizziness and giddiness: Secondary | ICD-10-CM | POA: Diagnosis not present

## 2020-07-14 DIAGNOSIS — R0602 Shortness of breath: Secondary | ICD-10-CM | POA: Diagnosis not present

## 2020-07-14 DIAGNOSIS — R079 Chest pain, unspecified: Secondary | ICD-10-CM | POA: Diagnosis not present

## 2020-07-23 DIAGNOSIS — F3132 Bipolar disorder, current episode depressed, moderate: Secondary | ICD-10-CM | POA: Diagnosis not present

## 2020-08-03 DIAGNOSIS — F3132 Bipolar disorder, current episode depressed, moderate: Secondary | ICD-10-CM | POA: Diagnosis not present

## 2020-08-10 ENCOUNTER — Emergency Department (HOSPITAL_COMMUNITY)
Admission: EM | Admit: 2020-08-10 | Discharge: 2020-08-10 | Disposition: A | Discharge status: Home / Self Care | Payer: Medicare Other | Attending: Emergency Medicine | Admitting: Emergency Medicine

## 2020-08-10 ENCOUNTER — Encounter (HOSPITAL_COMMUNITY): Payer: Self-pay | Admitting: Emergency Medicine

## 2020-08-10 ENCOUNTER — Emergency Department (HOSPITAL_COMMUNITY): Payer: Medicare Other

## 2020-08-10 DIAGNOSIS — X509XXA Other and unspecified overexertion or strenuous movements or postures, initial encounter: Secondary | ICD-10-CM | POA: Insufficient documentation

## 2020-08-10 DIAGNOSIS — Z79899 Other long term (current) drug therapy: Secondary | ICD-10-CM | POA: Insufficient documentation

## 2020-08-10 DIAGNOSIS — F17210 Nicotine dependence, cigarettes, uncomplicated: Secondary | ICD-10-CM | POA: Diagnosis not present

## 2020-08-10 DIAGNOSIS — S93401A Sprain of unspecified ligament of right ankle, initial encounter: Secondary | ICD-10-CM | POA: Insufficient documentation

## 2020-08-10 DIAGNOSIS — J45909 Unspecified asthma, uncomplicated: Secondary | ICD-10-CM | POA: Diagnosis not present

## 2020-08-10 DIAGNOSIS — S99911A Unspecified injury of right ankle, initial encounter: Secondary | ICD-10-CM | POA: Diagnosis present

## 2020-08-10 LAB — CBG MONITORING, ED: Glucose-Capillary: 101 mg/dL — ABNORMAL HIGH (ref 70–99)

## 2020-08-10 MED ORDER — NAPROXEN 500 MG PO TABS: 500.0000 mg | ORAL_TABLET | Freq: Two times a day (BID) | ORAL | 0 refills | Status: DC

## 2020-08-10 NOTE — Discharge Instructions (Addendum)

## 2020-08-10 NOTE — Progress Notes (Signed)
Orthopedic Tech Progress Note Patient Details:  Gene Rivera 1980-11-18 [id number]  Ortho Devices Type of Ortho Device: ASO Ortho Device/Splint Location: rle Ortho Device/Splint Interventions: Ordered,Application,Adjustment   Post Interventions Patient Tolerated: Well Instructions Provided: Care of device,Adjustment of device   Trinna Post 08/10/2020, 7:22 AM

## 2020-08-10 NOTE — ED Triage Notes (Signed)
Pt reports he sprang his right foot today.  "It was cold so I thought I would get it checked out."

## 2020-08-10 NOTE — ED Provider Notes (Signed)
[facility] EMERGENCY DEPARTMENT Provider Note   CSN: [id number] Arrival date & time: 08/10/20  0350     History Chief Complaint  Patient presents with  . Ankle Pain    Gene Rivera is a 40 y.o. male presents to the Emergency Department complaining of acute, persistent pain in the right lateral ankle onset yesterday afternoon.  Patient reports it was his birthday and he was "fooling around" with some friends throwing the football when he stumbled.  Patient denies falling or hitting his head.  He reports that he has been able to walk without difficulty.  Denies numbness, tingling or weakness however thought that he should probably get his ankle checked.  No previous history of injury or surgery to the ankle.  No open wounds.  He does think the joint is some swollen.  No treatment prior to arrival.  The history is provided by the patient and medical records. No language interpreter was used.       Past Medical History:  Diagnosis Date  . Asthma   . Bipolar affective disorder (HCC)     There are no problems to display for this patient.   Past Surgical History:  Procedure Laterality Date  . LOBECTOMY Left 1990  . LUNG SURGERY         Family History  Problem Relation Age of Onset  . Diabetes Mother   . Hypertension Mother     Social History   Tobacco Use  . Smoking status: Current Every Day Smoker    Packs/day: 0.50    Types: Cigarettes  . Smokeless tobacco: Never Used  Vaping Use  . Vaping Use: Never used  Substance Use Topics  . Alcohol use: Yes    Alcohol/week: 4.0 standard drinks    Types: 4 Cans of beer per week  . Drug use: Yes    Types: Marijuana, Cocaine    Comment: denies    Home Medications Prior to Admission medications   Medication Sig Start Date End Date Taking? Authorizing Provider  naproxen (NAPROSYN) 500 MG tablet Take 1 tablet (500 mg total) by mouth 2 (two) times daily with a meal. 08/10/20  Yes Sheronda Parran, Dahlia Client,  PA-C  acetaminophen (TYLENOL) 325 MG tablet Take 2 tablets (650 mg total) by mouth every 6 (six) hours as needed. 03/06/20   Darr, Gerilyn Pilgrim, PA-C  albuterol (VENTOLIN HFA) 108 (90 Base) MCG/ACT inhaler Inhale 2 puffs into the lungs every 4 (four) hours as needed for wheezing or shortness of breath. 06/24/19   Wieters, Hallie C, PA-C  clindamycin (CLEOCIN) 150 MG capsule Take 2 capsules (300 mg total) by mouth 3 (three) times daily. May dispense as 150mg  capsules 03/06/20   Darr, 03/08/20, PA-C  ibuprofen (ADVIL) 800 MG tablet Take 1 tablet (800 mg total) by mouth 3 (three) times daily. Take after course of prednisone 03/06/20   Darr, 03/08/20, PA-C  INVEGA SUSTENNA 234 MG/1.5ML SUSY injection Inject 234 mg into the muscle every 28 (twenty-eight) days.  10/13/18   [provider]  lidocaine (LIDODERM) 5 % Place 1 patch onto the skin daily. Remove & Discard patch within 12 hours or as directed by MD 11/08/19   Joy, 11/10/19, PA-C  methocarbamol (ROBAXIN) 750 MG tablet Take 1 tablet (750 mg total) by mouth 2 (two) times daily as needed for muscle spasms (or muscle tightness). 11/08/19   Joy, Shawn C, PA-C  sulfamethoxazole-trimethoprim (BACTRIM DS) 800-160 MG tablet Take 1 tablet by mouth 2 (two) times daily. 12/29/19  Wallis Bamberg, PA-C  traZODone (DESYREL) 100 MG tablet Take 100 mg by mouth at bedtime.  11/01/18   [provider]    Allergies    Penicillins  Review of Systems   Review of Systems  Musculoskeletal: Positive for arthralgias and joint swelling. Negative for gait problem.  Skin: Negative for wound.    Physical Exam Updated Vital Signs BP 126/86 (BP Location: Right Arm)   Pulse 81   Temp (!) 97.5 F (36.4 C) (Oral)   Resp 16   SpO2 98%   Physical Exam Vitals and nursing note reviewed.  Constitutional:      General: He is not in acute distress.    Appearance: He is well-developed and well-nourished.  HENT:     Head: Normocephalic.  Eyes:     General: No scleral  icterus.    Conjunctiva/sclera: Conjunctivae normal.  Cardiovascular:     Rate and Rhythm: Normal rate.     Pulses: Intact distal pulses.  Pulmonary:     Effort: Pulmonary effort is normal.  Musculoskeletal:        General: Normal range of motion.     Cervical back: Normal range of motion.     Right ankle: No deformity, ecchymosis or lacerations. Tenderness present over the lateral malleolus. Normal range of motion. Normal pulse.     Right Achilles Tendon: Normal.     Left ankle: Normal.     Left Achilles Tendon: Normal.  Skin:    General: Skin is warm and dry.  Neurological:     Mental Status: He is alert.     ED Results / Procedures / Treatments   Labs (all labs ordered are listed, but only abnormal results are displayed) Labs Reviewed  CBG MONITORING, ED - Abnormal; Notable for the following components:      Result Value   Glucose-Capillary 101 (*)    All other components within normal limits    Radiology DG Ankle Complete Right  Result Date: 08/10/2020 CLINICAL DATA:  Right ankle sprain EXAM: RIGHT ANKLE - COMPLETE 3+ VIEW COMPARISON:  None. FINDINGS: There is no evidence of fracture, dislocation, or joint effusion. There is no evidence of arthropathy or other focal bone abnormality. Soft tissues are unremarkable. IMPRESSION: Negative. Electronically Signed   By: Helyn Numbers MD   On: 08/10/2020 04:34    Procedures Procedures   Medications Ordered in ED Medications - No data to display  ED Course  I have reviewed the triage vital signs and the nursing notes.  Pertinent labs & imaging results that were available during my care of the patient were reviewed by me and considered in my medical decision making (see chart for details).    MDM Rules/Calculators/A&P                           Patient X-Ray negative for obvious fracture or dislocation.  I personally evaluated the images.  ASO placed in the ED.  Pt advised to follow up with orthopedics if symptoms  persist for possibility of missed fracture diagnosis. Patient given brace while in ED, conservative therapy recommended and discussed. Patient will be dc home & is agreeable with above plan.  Final Clinical Impression(s) / ED Diagnoses Final diagnoses:  Sprain of right ankle, unspecified ligament, initial encounter    Rx / DC Orders ED Discharge Orders         Ordered    naproxen (NAPROSYN) 500 MG tablet  2 times  daily with meals        08/10/20 0629           Aarini Slee, Boyd Kerbs 08/10/20 0630    Cardama, Amadeo Garnet, MD 08/10/20 [phone]

## 2020-08-28 ENCOUNTER — Other Ambulatory Visit: Payer: Self-pay

## 2020-08-28 ENCOUNTER — Encounter (HOSPITAL_COMMUNITY): Payer: Self-pay | Admitting: Emergency Medicine

## 2020-08-28 ENCOUNTER — Ambulatory Visit (HOSPITAL_COMMUNITY)
Admission: EM | Admit: 2020-08-28 | Discharge: 2020-08-28 | Disposition: A | Discharge status: Home / Self Care | Payer: Medicare HMO | Attending: Student | Admitting: Student

## 2020-08-28 DIAGNOSIS — K047 Periapical abscess without sinus: Secondary | ICD-10-CM

## 2020-08-28 DIAGNOSIS — K029 Dental caries, unspecified: Secondary | ICD-10-CM | POA: Diagnosis not present

## 2020-08-28 MED ORDER — CLINDAMYCIN HCL 150 MG PO CAPS: 300.0000 mg | ORAL_CAPSULE | Freq: Three times a day (TID) | ORAL | 0 refills | Status: AC

## 2020-08-28 MED ORDER — ACETAMINOPHEN 500 MG PO TABS: 500.0000 mg | ORAL_TABLET | Freq: Four times a day (QID) | ORAL | 0 refills | Status: AC | PRN

## 2020-08-28 MED ORDER — IBUPROFEN 800 MG PO TABS: 800.0000 mg | ORAL_TABLET | Freq: Three times a day (TID) | ORAL | 0 refills | Status: DC

## 2020-08-28 NOTE — Discharge Instructions (Addendum)
-  Start the antibiotic-clindamycin (Cleocin), 2 pills three times daily for 7 days.  If you have a sensitive stomach you can take this with food. Your infection should have resolved by the end of the 7 days, so you should not need to continue this for longer than 7 days.  -You can also take Tylenol and ibuprofen for additional pain relief.  I sent a prescription of both of these.  Make sure to always take ibuprofen with food. -Come back and see [place] if you develop worsening of your dental pain, infection, swelling, jaw pain, or new fever/chills.

## 2020-08-28 NOTE — ED Triage Notes (Signed)
Dental pain on left side of mouth, top and bottom teeth hurting.  Patient says a front tooth fell out yesterday.  Other teeth have been hurting 2-3 days

## 2020-08-28 NOTE — ED Provider Notes (Signed)
[facility]    CSN: [id number] Arrival date & time: 08/28/20  9163      History   Chief Complaint Chief Complaint  Patient presents with  . Dental Pain    HPI Gene Rivera is a 40 y.o. male presenting with dental pain.  History of asthma, bipolar disorder, lobectomy. Dental pain on left side of mouth, top and bottom teeth hurting.  Patient says a front tooth fell out yesterday.  Other teeth have been hurting 2-3 days.  States he knows he has to go see a dentist soon; he has one in mind but is not currently following with them.  Denies fever/chills.  HPI  Past Medical History:  Diagnosis Date  . Asthma   . Bipolar affective disorder (HCC)     There are no problems to display for this patient.   Past Surgical History:  Procedure Laterality Date  . LOBECTOMY Left 1990  . LUNG SURGERY         Home Medications    Prior to Admission medications   Medication Sig Start Date End Date Taking? Authorizing Provider  acetaminophen (TYLENOL) 500 MG tablet Take 1 tablet (500 mg total) by mouth every 6 (six) hours as needed. 08/28/20  Yes Rhys Martini, PA-C  clindamycin (CLEOCIN) 150 MG capsule Take 2 capsules (300 mg total) by mouth 3 (three) times daily for 7 days. 08/28/20 09/04/20 Yes Rhys Martini, PA-C  ibuprofen (ADVIL) 800 MG tablet Take 1 tablet (800 mg total) by mouth 3 (three) times daily. 08/28/20  Yes Rhys Martini, PA-C  INVEGA SUSTENNA 234 MG/1.5ML SUSY injection Inject 234 mg into the muscle every 28 (twenty-eight) days.  10/13/18  Yes [provider]  traZODone (DESYREL) 100 MG tablet Take 100 mg by mouth at bedtime.  11/01/18  Yes [provider]  albuterol (VENTOLIN HFA) 108 (90 Base) MCG/ACT inhaler Inhale 2 puffs into the lungs every 4 (four) hours as needed for wheezing or shortness of breath. 06/24/19   Wieters, Hallie C, PA-C  lidocaine (LIDODERM) 5 % Place 1 patch onto the skin daily. Remove & Discard patch within 12 hours or  as directed by MD 11/08/19   Joy, Hillard Danker, PA-C  methocarbamol (ROBAXIN) 750 MG tablet Take 1 tablet (750 mg total) by mouth 2 (two) times daily as needed for muscle spasms (or muscle tightness). 11/08/19   Joy, Shawn C, PA-C  naproxen (NAPROSYN) 500 MG tablet Take 1 tablet (500 mg total) by mouth 2 (two) times daily with a meal. 08/10/20   Muthersbaugh, Dahlia Client, PA-C    Family History Family History  Problem Relation Age of Onset  . Diabetes Mother   . Hypertension Mother     Social History Social History   Tobacco Use  . Smoking status: Current Every Day Smoker    Packs/day: 0.50    Types: Cigarettes  . Smokeless tobacco: Never Used  Vaping Use  . Vaping Use: Never used  Substance Use Topics  . Alcohol use: Yes    Alcohol/week: 4.0 standard drinks    Types: 4 Cans of beer per week  . Drug use: Yes    Types: Marijuana, Cocaine    Comment: denies     Allergies   Penicillins   Review of Systems Review of Systems  HENT: Positive for dental problem.   All other systems reviewed and are negative.    Physical Exam Triage Vital Signs ED Triage Vitals  Enc Vitals Group  BP 08/28/20 0837 110/69     Pulse Rate 08/28/20 0837 78     Resp 08/28/20 0837 18     Temp 08/28/20 0837 98.1 F (36.7 C)     Temp Source 08/28/20 0837 Oral     SpO2 08/28/20 0837 98 %     Weight --      Height --      Head Circumference --      Peak Flow --      Pain Score 08/28/20 0833 10     Pain Loc --      Pain Edu? --      Excl. in GC? --    No data found.  Updated Vital Signs BP 110/69 (BP Location: Right Arm)   Pulse 78   Temp 98.1 F (36.7 C) (Oral)   Resp 18   SpO2 98%   Visual Acuity Right Eye Distance:   Left Eye Distance:   Bilateral Distance:    Right Eye Near:   Left Eye Near:    Bilateral Near:     Physical Exam Vitals reviewed.  Constitutional:      Appearance: Normal appearance.  HENT:     Head:     Comments: Dentition overall very poor with numerous  broken and decaying teeth. Central incisor is broken with evidence of gingival swelling and buccal swelling. Mild swelling and tenderness of gingiva surrounding L lower molar.   Oropharynx clear. Controlling secretions. No trismus. No submandibular or submental swelling. Cardiovascular:     Rate and Rhythm: Normal rate and regular rhythm.     Heart sounds: Normal heart sounds.  Pulmonary:     Effort: Pulmonary effort is normal.  Neurological:     General: No focal deficit present.     Mental Status: He is alert and oriented to person, place, and time.  Psychiatric:        Mood and Affect: Mood normal.        Behavior: Behavior normal.        Thought Content: Thought content normal.        Judgment: Judgment normal.      UC Treatments / Results  Labs (all labs ordered are listed, but only abnormal results are displayed) Labs Reviewed - No data to display  EKG   Radiology No results found.  Procedures Procedures (including critical care time)  Medications Ordered in UC Medications - No data to display  Initial Impression / Assessment and Plan / UC Course  I have reviewed the triage vital signs and the nursing notes.  Pertinent labs & imaging results that were available during my care of the patient were reviewed by me and considered in my medical decision making (see chart for details).       This patient is a 40 year old male presenting with dental infection. Penicillin allergy, so will treat with clindamycin. Ibuprofen and tylenol sent for pain. Again advised this patient he must follow with dentist; he has one in mind that he plans to see.   Return precautions- chest pain, shortness of breath, new/worsening fevers/chills, confusion, worsening of symptoms despite the above treatment plan, etc.   Spent over 30 minutes obtaining H&P, performing physical, discussing results, treatment plan and plan for follow-up with patient. Patient agrees with plan.   This chart was  dictated using voice recognition software, Dragon. Despite the best efforts of this provider to proofread and correct errors, errors may still occur which can change documentation meaning.   Final Clinical  Impressions(s) / UC Diagnoses   Final diagnoses:  Dental infection  Dental caries     Discharge Instructions     -Start the antibiotic-clindamycin (Cleocin), 2 pills three times daily for 7 days.  If you have a sensitive stomach you can take this with food. Your infection should have resolved by the end of the 7 days, so you should not need to continue this for longer than 7 days.  -You can also take Tylenol and ibuprofen for additional pain relief.  I sent a prescription of both of these.  Make sure to always take ibuprofen with food. -Come back and see [place] if you develop worsening of your dental pain, infection, swelling, jaw pain, or new fever/chills.    ED Prescriptions    Medication Sig Dispense Auth. Provider   clindamycin (CLEOCIN) 150 MG capsule Take 2 capsules (300 mg total) by mouth 3 (three) times daily for 7 days. 42 capsule Rhys Martini, PA-C   ibuprofen (ADVIL) 800 MG tablet Take 1 tablet (800 mg total) by mouth 3 (three) times daily. 21 tablet Rhys Martini, PA-C   acetaminophen (TYLENOL) 500 MG tablet Take 1 tablet (500 mg total) by mouth every 6 (six) hours as needed. 30 tablet Rhys Martini, PA-C     PDMP not reviewed this encounter.   Rhys Martini, PA-C 08/28/20 [phone]

## 2020-09-08 ENCOUNTER — Ambulatory Visit (HOSPITAL_COMMUNITY)
Admission: EM | Admit: 2020-09-08 | Discharge: 2020-09-08 | Disposition: A | Discharge status: Home / Self Care | Payer: Medicare HMO | Attending: Emergency Medicine | Admitting: Emergency Medicine

## 2020-09-08 ENCOUNTER — Encounter (HOSPITAL_COMMUNITY): Payer: Self-pay | Admitting: *Deleted

## 2020-09-08 ENCOUNTER — Other Ambulatory Visit: Payer: Self-pay

## 2020-09-08 DIAGNOSIS — K029 Dental caries, unspecified: Secondary | ICD-10-CM

## 2020-09-08 DIAGNOSIS — K047 Periapical abscess without sinus: Secondary | ICD-10-CM | POA: Diagnosis not present

## 2020-09-08 MED ORDER — CLINDAMYCIN HCL 300 MG PO CAPS: 300.0000 mg | ORAL_CAPSULE | Freq: Three times a day (TID) | ORAL | 0 refills | Status: DC

## 2020-09-08 MED ORDER — IBUPROFEN 800 MG PO TABS: 800.0000 mg | ORAL_TABLET | Freq: Three times a day (TID) | ORAL | 0 refills | Status: DC

## 2020-09-08 NOTE — ED Triage Notes (Signed)
C/O left lower dental pain x 3 wks without fever.

## 2020-09-08 NOTE — ED Provider Notes (Signed)
[facility]    CSN: [id number] Arrival date & time: 09/08/20  1004      History   Chief Complaint Chief Complaint  Patient presents with  . Dental Pain    HPI Gene Rivera is a 40 y.o. male.   Patient presents with left lower and right lower dental pain x3 weeks.  He recently took an antibiotic and reports the pain improved but returned after finishing the course.  He also reports right side facial swelling.  He denies fever, chills, difficulty swallowing, or other symptoms.  He has an appointment with a dentist in a couple of weeks.  Patient was seen here on 08/28/2020; diagnosed with dental infection, dental caries; treated with clindamycin and ibuprofen.  His medical history includes asthma and bipolar affective disorder.  The history is provided by the patient and medical records.    Past Medical History:  Diagnosis Date  . Asthma   . Bipolar affective disorder (HCC)     There are no problems to display for this patient.   Past Surgical History:  Procedure Laterality Date  . LOBECTOMY Left 1990  . LUNG SURGERY         Home Medications    Prior to Admission medications   Medication Sig Start Date End Date Taking? Authorizing Provider  clindamycin (CLEOCIN) 300 MG capsule Take 1 capsule (300 mg total) by mouth 3 (three) times daily. 09/08/20  Yes Mickie Bail, NP  INVEGA SUSTENNA 234 MG/1.5ML SUSY injection Inject 234 mg into the muscle every 28 (twenty-eight) days.  10/13/18  Yes [provider]  acetaminophen (TYLENOL) 500 MG tablet Take 1 tablet (500 mg total) by mouth every 6 (six) hours as needed. 08/28/20   Rhys Martini, PA-C  albuterol (VENTOLIN HFA) 108 (90 Base) MCG/ACT inhaler Inhale 2 puffs into the lungs every 4 (four) hours as needed for wheezing or shortness of breath. 06/24/19   Wieters, Hallie C, PA-C  ibuprofen (ADVIL) 800 MG tablet Take 1 tablet (800 mg total) by mouth 3 (three) times daily. 09/08/20   Mickie Bail, NP   traZODone (DESYREL) 100 MG tablet Take 100 mg by mouth at bedtime.  11/01/18   [provider]    Family History Family History  Problem Relation Age of Onset  . Diabetes Mother   . Hypertension Mother     Social History Social History   Tobacco Use  . Smoking status: Current Every Day Smoker    Packs/day: 0.50    Types: Cigarettes  . Smokeless tobacco: Never Used  Vaping Use  . Vaping Use: Never used  Substance Use Topics  . Alcohol use: Yes    Alcohol/week: 4.0 standard drinks    Types: 4 Cans of beer per week  . Drug use: Yes    Types: Marijuana, Cocaine    Comment: Denies current cocaine use     Allergies   Penicillins   Review of Systems Review of Systems  Constitutional: Negative for chills and fever.  HENT: Positive for dental problem. Negative for ear pain, sore throat and trouble swallowing.   Eyes: Negative for pain and visual disturbance.  Respiratory: Negative for cough and shortness of breath.   Cardiovascular: Negative for chest pain and palpitations.  Gastrointestinal: Negative for abdominal pain and vomiting.  Genitourinary: Negative for dysuria and hematuria.  Musculoskeletal: Negative for arthralgias and back pain.  Skin: Negative for color change and rash.  Neurological: Negative for seizures and syncope.  All other  systems reviewed and are negative.    Physical Exam Triage Vital Signs ED Triage Vitals  Enc Vitals Group     BP      Pulse      Resp      Temp      Temp src      SpO2      Weight      Height      Head Circumference      Peak Flow      Pain Score      Pain Loc      Pain Edu?      Excl. in GC?    No data found.  Updated Vital Signs BP 107/69   Pulse 72   Temp 98.3 F (36.8 C) (Oral)   Resp 16   SpO2 100%   Visual Acuity Right Eye Distance:   Left Eye Distance:   Bilateral Distance:    Right Eye Near:   Left Eye Near:    Bilateral Near:     Physical Exam Vitals and nursing note reviewed.   Constitutional:      Appearance: He is well-developed and well-nourished.  HENT:     Head: Normocephalic and atraumatic.     Mouth/Throat:     Mouth: Mucous membranes are moist.     Dentition: Abnormal dentition. Dental tenderness and dental caries present.     Pharynx: Oropharynx is clear.      Comments: Mild swelling noted on right cheek. Eyes:     Conjunctiva/sclera: Conjunctivae normal.  Cardiovascular:     Rate and Rhythm: Normal rate and regular rhythm.     Heart sounds: Normal heart sounds.  Pulmonary:     Effort: Pulmonary effort is normal. No respiratory distress.     Breath sounds: Normal breath sounds.  Abdominal:     Palpations: Abdomen is soft.     Tenderness: There is no abdominal tenderness.  Musculoskeletal:        General: No edema.     Cervical back: Neck supple.  Skin:    General: Skin is warm and dry.  Neurological:     General: No focal deficit present.     Mental Status: He is alert and oriented to person, place, and time.     Gait: Gait normal.  Psychiatric:        Mood and Affect: Mood and affect and mood normal.        Behavior: Behavior normal.      UC Treatments / Results  Labs (all labs ordered are listed, but only abnormal results are displayed) Labs Reviewed - No data to display  EKG   Radiology No results found.  Procedures Procedures (including critical care time)  Medications Ordered in UC Medications - No data to display  Initial Impression / Assessment and Plan / UC Course  I have reviewed the triage vital signs and the nursing notes.  Pertinent labs & imaging results that were available during my care of the patient were reviewed by me and considered in my medical decision making (see chart for details).   Dental pain due to dental caries, dental infection.  Treating with a second round of clindamycin and refill on ibuprofen.  Instructed patient to call his dentist on Monday to see if he can be seen sooner.  Patient  agrees to plan of care.   Final Clinical Impressions(s) / UC Diagnoses   Final diagnoses:  Pain due to dental caries  Dental infection  Discharge Instructions     Take the antibiotic as prescribed.    A dental resource guide is attached.  Please call to make an appointment with a dentist as soon as possible.         ED Prescriptions    Medication Sig Dispense Auth. Provider   ibuprofen (ADVIL) 800 MG tablet Take 1 tablet (800 mg total) by mouth 3 (three) times daily. 21 tablet Mickie Bail, NP   clindamycin (CLEOCIN) 300 MG capsule Take 1 capsule (300 mg total) by mouth 3 (three) times daily. 21 capsule Mickie Bail, NP     PDMP not reviewed this encounter.   Mickie Bail, NP 09/08/20 1047

## 2020-09-08 NOTE — Discharge Instructions (Signed)
Take the antibiotic as prescribed.    A dental resource guide is attached.  Please call to make an appointment with a dentist as soon as possible.      

## 2021-03-02 ENCOUNTER — Encounter (HOSPITAL_COMMUNITY): Payer: Self-pay

## 2021-03-02 ENCOUNTER — Ambulatory Visit (HOSPITAL_COMMUNITY)
Admission: EM | Admit: 2021-03-02 | Discharge: 2021-03-02 | Disposition: A | Discharge status: Home / Self Care | Payer: Medicare HMO | Attending: Medical Oncology | Admitting: Medical Oncology

## 2021-03-02 ENCOUNTER — Other Ambulatory Visit: Payer: Self-pay

## 2021-03-02 DIAGNOSIS — L232 Allergic contact dermatitis due to cosmetics: Secondary | ICD-10-CM

## 2021-03-02 MED ORDER — FAMOTIDINE 20 MG PO TABS: 20.0000 mg | ORAL_TABLET | Freq: Two times a day (BID) | ORAL | 0 refills | Status: DC

## 2021-03-02 MED ORDER — CETIRIZINE HCL 10 MG PO TABS: 10.0000 mg | ORAL_TABLET | Freq: Every day | ORAL | 0 refills | Status: AC

## 2021-03-02 MED ORDER — TRIAMCINOLONE ACETONIDE 0.1 % EX CREA: 1.0000 "application " | TOPICAL_CREAM | Freq: Two times a day (BID) | CUTANEOUS | 0 refills | Status: DC

## 2021-03-02 NOTE — ED Triage Notes (Signed)
Pt presents with rash on both arms and chest X 4 days.

## 2021-03-02 NOTE — ED Provider Notes (Signed)
[facility]    CSN: [id number] Arrival date & time: 03/02/21  1742      History   Chief Complaint Chief Complaint  Patient presents with   Rash    HPI Gene Rivera is a 40 y.o. male.   HPI  Rash: Patient states that over the past 2 to 3 days he has noticed an itchy rash of his arms and trunk.  He states that symptoms started after he used a new body wash and gain laundry detergent.  He denies any other new medications, products or exposures.  He has not tried anything for symptoms.  He denies any trouble breathing or trouble swallowing or shortness of breath.  No fevers.  Past Medical History:  Diagnosis Date   Asthma    Bipolar affective disorder (HCC)     There are no problems to display for this patient.   Past Surgical History:  Procedure Laterality Date   LOBECTOMY Left 1990   LUNG SURGERY      Home Medications    Prior to Admission medications   Medication Sig Start Date End Date Taking? Authorizing Provider  cetirizine (ZYRTEC ALLERGY) 10 MG tablet Take 1 tablet (10 mg total) by mouth daily. 03/02/21  Yes Tisheena Maguire, Maralyn Sago M, PA-C  famotidine (PEPCID) 20 MG tablet Take 1 tablet (20 mg total) by mouth 2 (two) times daily. 03/02/21  Yes CovingtonMaralyn Sago M, PA-C  triamcinolone cream (KENALOG) 0.1 % Apply 1 application topically 2 (two) times daily. 03/02/21  Yes Mayce Noyes M, PA-C  acetaminophen (TYLENOL) 500 MG tablet Take 1 tablet (500 mg total) by mouth every 6 (six) hours as needed. 08/28/20   Rhys Martini, PA-C  albuterol (VENTOLIN HFA) 108 (90 Base) MCG/ACT inhaler Inhale 2 puffs into the lungs every 4 (four) hours as needed for wheezing or shortness of breath. 06/24/19   Wieters, Hallie C, PA-C  ibuprofen (ADVIL) 800 MG tablet Take 1 tablet (800 mg total) by mouth 3 (three) times daily. 09/08/20   Mickie Bail, NP  INVEGA SUSTENNA 234 MG/1.5ML SUSY injection Inject 234 mg into the muscle every 28 (twenty-eight) days.  10/13/18   [provider]  traZODone (DESYREL) 100 MG tablet Take 100 mg by mouth at bedtime.  11/01/18   [provider]    Family History Family History  Problem Relation Age of Onset   Diabetes Mother    Hypertension Mother     Social History Social History   Tobacco Use   Smoking status: Every Day    Packs/day: 0.50    Types: Cigarettes   Smokeless tobacco: Never  Vaping Use   Vaping Use: Never used  Substance Use Topics   Alcohol use: Yes    Alcohol/week: 4.0 standard drinks    Types: 4 Cans of beer per week   Drug use: Yes    Types: Marijuana, Cocaine    Comment: Denies current cocaine use     Allergies   Penicillins   Review of Systems Review of Systems  As stated above in HPI Physical Exam Triage Vital Signs ED Triage Vitals  Enc Vitals Group     BP 03/02/21 1818 122/77     Pulse Rate 03/02/21 1818 94     Resp 03/02/21 1818 18     Temp 03/02/21 1818 98.9 F (37.2 C)     Temp Source 03/02/21 1818 Oral     SpO2 03/02/21 1818 98 %     Weight --  Height --      Head Circumference --      Peak Flow --      Pain Score 03/02/21 1822 0     Pain Loc --      Pain Edu? --      Excl. in GC? --    No data found.  Updated Vital Signs BP 122/77 (BP Location: Right Arm)   Pulse 94   Temp 98.9 F (37.2 C) (Oral)   Resp 18   SpO2 98%   Physical Exam Vitals and nursing note reviewed.  Constitutional:      General: He is not in acute distress.    Appearance: Normal appearance. He is not ill-appearing, toxic-appearing or diaphoretic.  HENT:     Mouth/Throat:     Pharynx: Oropharynx is clear.  Cardiovascular:     Rate and Rhythm: Normal rate and regular rhythm.     Heart sounds: Normal heart sounds.  Pulmonary:     Effort: Pulmonary effort is normal.     Breath sounds: Normal breath sounds.  Skin:    General: Skin is warm.     Findings: Rash (scattered mild erythema and rough small raised papules or arms and body) present.  Neurological:      Mental Status: He is alert.     UC Treatments / Results  Labs (all labs ordered are listed, but only abnormal results are displayed) Labs Reviewed - No data to display  EKG   Radiology No results found.  Procedures Procedures (including critical care time)  Medications Ordered in UC Medications - No data to display  Initial Impression / Assessment and Plan / UC Course  I have reviewed the triage vital signs and the nursing notes.  Pertinent labs & imaging results that were available during my care of the patient were reviewed by me and considered in my medical decision making (see chart for details).     New.  Likely contact irritant dermatitis.  Discussed with patient.  I have recommended that he switch to Free and clear products and avoid anything with fragrance or dyes.  In addition I am sending him in Zyrtec and Pepcid to take to help with symptoms along with triamcinolone cream.  Discussed red flag signs and symptoms. Final Clinical Impressions(s) / UC Diagnoses   Final diagnoses:  Allergic contact dermatitis due to cosmetics   Discharge Instructions   None    ED Prescriptions     Medication Sig Dispense Auth. Provider   cetirizine (ZYRTEC ALLERGY) 10 MG tablet Take 1 tablet (10 mg total) by mouth daily. 30 tablet Rocko Fesperman M, PA-C   famotidine (PEPCID) 20 MG tablet Take 1 tablet (20 mg total) by mouth 2 (two) times daily. 30 tablet Cortney Beissel M, [place]   triamcinolone cream (KENALOG) 0.1 % Apply 1 application topically 2 (two) times daily. 453.6 g Rushie Chestnut, [place]      PDMP not reviewed this encounter.   Rushie Chestnut, [place] 03/02/21 1846

## 2021-06-18 ENCOUNTER — Other Ambulatory Visit: Payer: Self-pay

## 2021-06-18 ENCOUNTER — Emergency Department
Admission: EM | Admit: 2021-06-18 | Discharge: 2021-06-18 | Disposition: A | Discharge status: Home / Self Care | Payer: Medicare HMO | Attending: Emergency Medicine | Admitting: Emergency Medicine

## 2021-06-18 DIAGNOSIS — F17210 Nicotine dependence, cigarettes, uncomplicated: Secondary | ICD-10-CM | POA: Diagnosis not present

## 2021-06-18 DIAGNOSIS — Y9367 Activity, basketball: Secondary | ICD-10-CM | POA: Insufficient documentation

## 2021-06-18 DIAGNOSIS — Y990 Civilian activity done for income or pay: Secondary | ICD-10-CM | POA: Insufficient documentation

## 2021-06-18 DIAGNOSIS — X500XXA Overexertion from strenuous movement or load, initial encounter: Secondary | ICD-10-CM | POA: Insufficient documentation

## 2021-06-18 DIAGNOSIS — S3992XA Unspecified injury of lower back, initial encounter: Secondary | ICD-10-CM | POA: Diagnosis present

## 2021-06-18 DIAGNOSIS — J45909 Unspecified asthma, uncomplicated: Secondary | ICD-10-CM | POA: Diagnosis not present

## 2021-06-18 DIAGNOSIS — S39012A Strain of muscle, fascia and tendon of lower back, initial encounter: Secondary | ICD-10-CM | POA: Diagnosis not present

## 2021-06-18 MED ORDER — CYCLOBENZAPRINE HCL 10 MG PO TABS
10.0000 mg | ORAL_TABLET | Freq: Once | ORAL | Status: AC
  Administered 2021-06-18: 10 mg via ORAL
  Filled 2021-06-18: qty 1

## 2021-06-18 MED ORDER — CYCLOBENZAPRINE HCL 5 MG PO TABS: 5.0000 mg | ORAL_TABLET | Freq: Three times a day (TID) | ORAL | 0 refills | Status: DC | PRN

## 2021-06-18 NOTE — ED Notes (Signed)
Pt c/o b/l low back pain since Sat. 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 Pt took tylenol 2 days ago with some relief, but none since.

## 2021-06-18 NOTE — ED Triage Notes (Signed)
Pt here with back pain for a few days. 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 Pt in NAD in triage.

## 2021-06-18 NOTE — ED Provider Notes (Signed)
[facility] EMERGENCY DEPARTMENT Provider Note   CSN: [id number] Arrival date & time: 06/18/21  1754     History Chief Complaint  Patient presents with   Back Pain    Gene Rivera is a 39 y.o. male.  Presents to the emergency department for evaluation of 4 days of left lower back pain.  Patient states he injured the back playing basketball, has been having tightness and spasms.  He is also had pain from lifting at work.  Patient states he was lifting furniture.  He denies any numbness tingling radicular symptoms.  No weakness in lower extremities.  He has taken Tylenol with little relief.  Back pain is moderate.  Denies any abdominal pain, urinary symptoms, chest pain, shortness of breath.  No history of IV drug abuse   HPI     Past Medical History:  Diagnosis Date   Asthma    Bipolar affective disorder (HCC)     There are no problems to display for this patient.   Past Surgical History:  Procedure Laterality Date   LOBECTOMY Left 1990   LUNG SURGERY         Family History  Problem Relation Age of Onset   Diabetes Mother    Hypertension Mother     Social History   Tobacco Use   Smoking status: Every Day    Packs/day: 0.50    Types: Cigarettes   Smokeless tobacco: Never  Vaping Use   Vaping Use: Never used  Substance Use Topics   Alcohol use: Yes    Alcohol/week: 4.0 standard drinks    Types: 4 Cans of beer per week   Drug use: Yes    Types: Marijuana, Cocaine    Comment: Denies current cocaine use    Home Medications Prior to Admission medications   Medication Sig Start Date End Date Taking? Authorizing Provider  cyclobenzaprine (FLEXERIL) 5 MG tablet Take 1-2 tablets (5-10 mg total) by mouth 3 (three) times daily as needed for muscle spasms. 06/18/21  Yes Evon Slack, PA-C  acetaminophen (TYLENOL) 500 MG tablet Take 1 tablet (500 mg total) by mouth every 6 (six) hours as needed. 08/28/20   Rhys Martini, PA-C   albuterol (VENTOLIN HFA) 108 (90 Base) MCG/ACT inhaler Inhale 2 puffs into the lungs every 4 (four) hours as needed for wheezing or shortness of breath. 06/24/19   Wieters, Hallie C, PA-C  cetirizine (ZYRTEC ALLERGY) 10 MG tablet Take 1 tablet (10 mg total) by mouth daily. 03/02/21   Rushie Chestnut, PA-C  famotidine (PEPCID) 20 MG tablet Take 1 tablet (20 mg total) by mouth 2 (two) times daily. 03/02/21   Rushie Chestnut, PA-C  ibuprofen (ADVIL) 800 MG tablet Take 1 tablet (800 mg total) by mouth 3 (three) times daily. 09/08/20   Mickie Bail, NP  INVEGA SUSTENNA 234 MG/1.5ML SUSY injection Inject 234 mg into the muscle every 28 (twenty-eight) days.  10/13/18   [provider]  traZODone (DESYREL) 100 MG tablet Take 100 mg by mouth at bedtime.  11/01/18   [provider]  triamcinolone cream (KENALOG) 0.1 % Apply 1 application topically 2 (two) times daily. 03/02/21   Rushie Chestnut, PA-C    Allergies    Penicillins  Review of Systems   Review of Systems  Constitutional:  Negative for fever.  Respiratory:  Negative for cough and shortness of breath.   Cardiovascular:  Negative for chest pain.  Gastrointestinal:  Negative for abdominal  pain, nausea and vomiting.  Genitourinary:  Negative for dysuria and flank pain.  Musculoskeletal:  Positive for back pain and myalgias. Negative for gait problem and joint swelling.  Skin:  Negative for rash.   Physical Exam Updated Vital Signs BP 112/68 (BP Location: Left Arm)   Pulse 80   Temp 98.5 F (36.9 C) (Oral)   Resp 20   Ht 5\' 9"  (1.753 m)   Wt 78 kg   SpO2 96%   BMI 25.40 kg/m   Physical Exam Constitutional:      Appearance: He is well-developed.  HENT:     Head: Normocephalic and atraumatic.  Eyes:     Conjunctiva/sclera: Conjunctivae normal.  Cardiovascular:     Rate and Rhythm: Normal rate.  Pulmonary:     Effort: Pulmonary effort is normal. No respiratory distress.  Musculoskeletal:        General:  Normal range of motion.     Cervical back: Normal range of motion.     Comments: Patient ambulatory with no antalgic gait.  No assistive devices.  Nontender along the lumbar spinous process.  Has some left paravertebral muscle tenderness.  No SI tenderness or iliac crest tenderness.  No swelling warmth erythema or bruising.  No abdominal pain on exam.  Both hips move well with internal ex rotation with no discomfort.  He is neurovascular tact in bilateral lower extremities.  Skin:    General: Skin is warm.     Findings: No rash.  Neurological:     Mental Status: He is alert and oriented to person, place, and time.  Psychiatric:        Behavior: Behavior normal.        Thought Content: Thought content normal.    ED Results / Procedures / Treatments   Labs (all labs ordered are listed, but only abnormal results are displayed) Labs Reviewed - No data to display  EKG None  Radiology No results found.  Procedures Procedures   Medications Ordered in ED Medications  cyclobenzaprine (FLEXERIL) tablet 10 mg (has no administration in time range)    ED Course  I have reviewed the triage vital signs and the nursing notes.  Pertinent labs & imaging results that were available during my care of the patient were reviewed by me and considered in my medical decision making (see chart for details).    MDM Rules/Calculators/A&P                         40 year old male with left lower back pain consistent with muscular strain/spasm.  Injured 4 days ago playing basketball and then reinjured couple days ago lifting furniture.  Has taken Tylenol with mild relief.  No radicular symptoms or neurological deficits.  No fevers or abdominal pain.  Patient started on Flexeril.  He is given information on back stretches, will alternate Tylenol and ibuprofen and follow-up with PCP or orthopedist in 1 week if no relief Final Clinical Impression(s) / ED Diagnoses Final diagnoses:  Strain of lumbar region,  initial encounter    Rx / DC Orders ED Discharge Orders          Ordered    cyclobenzaprine (FLEXERIL) 5 MG tablet  3 times daily PRN        06/18/21 2039             2040 06/18/21 2043    2044, MD 06/18/21 2138

## 2021-06-18 NOTE — Discharge Instructions (Signed)
Please take medication as prescribed for muscle spasms.  You may alternate Tylenol and ibuprofen as needed.  Perform gentle stretching exercises and avoid heavy lifting pushing and pulling.  Return to the ER for any fevers, increasing pain, worsening symptoms or urgent changes in your health.

## 2021-08-05 ENCOUNTER — Emergency Department
Admission: EM | Admit: 2021-08-05 | Discharge: 2021-08-05 | Disposition: A | Discharge status: Home / Self Care | Payer: Medicare Other | Attending: Emergency Medicine | Admitting: Emergency Medicine

## 2021-08-05 ENCOUNTER — Other Ambulatory Visit: Payer: Self-pay

## 2021-08-05 ENCOUNTER — Encounter: Payer: Self-pay | Admitting: Emergency Medicine

## 2021-08-05 ENCOUNTER — Emergency Department: Payer: Medicare Other

## 2021-08-05 DIAGNOSIS — R0602 Shortness of breath: Secondary | ICD-10-CM | POA: Diagnosis present

## 2021-08-05 DIAGNOSIS — Z20822 Contact with and (suspected) exposure to covid-19: Secondary | ICD-10-CM | POA: Diagnosis not present

## 2021-08-05 DIAGNOSIS — J45901 Unspecified asthma with (acute) exacerbation: Secondary | ICD-10-CM | POA: Insufficient documentation

## 2021-08-05 LAB — CBC
HCT: 42.5 % (ref 39.0–52.0)
Hemoglobin: 14.1 g/dL (ref 13.0–17.0)
MCH: 29.9 pg (ref 26.0–34.0)
MCHC: 33.2 g/dL (ref 30.0–36.0)
MCV: 90.2 fL (ref 80.0–100.0)
Platelets: 246 10*3/uL (ref 150–400)
RBC: 4.71 MIL/uL (ref 4.22–5.81)
RDW: 12.2 % (ref 11.5–15.5)
WBC: 8 10*3/uL (ref 4.0–10.5)
nRBC: 0 % (ref 0.0–0.2)

## 2021-08-05 LAB — BASIC METABOLIC PANEL
Anion gap: 7 (ref 5–15)
BUN: 10 mg/dL (ref 6–20)
CO2: 29 mmol/L (ref 22–32)
Calcium: 9.2 mg/dL (ref 8.9–10.3)
Chloride: 101 mmol/L (ref 98–111)
Creatinine, Ser: 1.14 mg/dL (ref 0.61–1.24)
GFR, Estimated: >60 mL/min (ref >60)
Glucose, Bld: 110 mg/dL — ABNORMAL HIGH (ref 70–99)
Potassium: 3.9 mmol/L (ref 3.5–5.1)
Sodium: 137 mmol/L (ref 135–145)

## 2021-08-05 LAB — RESP PANEL BY RT-PCR (FLU A&B, COVID) ARPGX2
Influenza A by PCR: NEGATIVE
Influenza B by PCR: NEGATIVE
SARS Coronavirus 2 by RT PCR: NEGATIVE

## 2021-08-05 LAB — TROPONIN I (HIGH SENSITIVITY): Troponin I (High Sensitivity): 5 ng/L (ref <18)

## 2021-08-05 MED ORDER — PREDNISONE 20 MG PO TABS: 60.0000 mg | ORAL_TABLET | Freq: Every day | ORAL | 0 refills | Status: AC

## 2021-08-05 MED ORDER — ALBUTEROL SULFATE HFA 108 (90 BASE) MCG/ACT IN AERS: 2.0000 | INHALATION_SPRAY | Freq: Four times a day (QID) | RESPIRATORY_TRACT | 0 refills | Status: DC | PRN

## 2021-08-05 MED ORDER — IPRATROPIUM-ALBUTEROL 0.5-2.5 (3) MG/3ML IN SOLN
3.0000 mL | Freq: Once | RESPIRATORY_TRACT | Status: AC
  Administered 2021-08-05: 3 mL via RESPIRATORY_TRACT
  Filled 2021-08-05: qty 3

## 2021-08-05 MED ORDER — PREDNISONE 20 MG PO TABS
60.0000 mg | ORAL_TABLET | Freq: Once | ORAL | Status: AC
  Administered 2021-08-05: 60 mg via ORAL
  Filled 2021-08-05: qty 3

## 2021-08-05 NOTE — ED Triage Notes (Signed)
Pt to ED via POV with c/o Shortness of breath with cough and rattle in chest. His cough is productive and is yellow. He is able to speak in complete clear sentences.

## 2021-08-05 NOTE — ED Provider Notes (Signed)
[facility] Provider Note    Event Date/Time   First MD Initiated Contact with Patient 08/05/21 2053     (approximate)   History   Chief Complaint Shortness of Breath   HPI  Gene Rivera is a 41 y.o. male with past medical history of bipolar disorder and asthma who presents to the ED complaining of chest pain and shortness of breath.  Patient reports that he has had about 2 days of cough productive of yellowish sputum.  Over that time, he states that his breathing has been increasingly difficult and he woke up this morning with sensation of tightness and pain in his chest.  He denies any fevers and has not had any abdominal pain, nausea, vomiting, or diarrhea.  He describes symptoms as similar to when his asthma flares up, but states he ran out of his inhaler recently.  He has not take anything for his symptoms at home.     Physical Exam   Triage Vital Signs: ED Triage Vitals  Enc Vitals Group     BP 08/05/21 2027 (!) 165/87     Pulse Rate 08/05/21 2027 80     Resp 08/05/21 2027 20     Temp 08/05/21 2027 98.8 F (37.1 C)     Temp Source 08/05/21 2027 Oral     SpO2 08/05/21 2027 99 %     Weight 08/05/21 2029 170 lb (77.1 kg)     Height 08/05/21 2029 5\' 9"  (1.753 m)     Head Circumference --      Peak Flow --      Pain Score 08/05/21 2029 8     Pain Loc --      Pain Edu? --      Excl. in GC? --     Most recent vital signs: Vitals:   08/05/21 2027  BP: (!) 165/87  Pulse: 80  Resp: 20  Temp: 98.8 F (37.1 C)  SpO2: 99%    Constitutional: Alert and oriented. Eyes: Conjunctivae are normal. Head: Atraumatic. Nose: No congestion/rhinnorhea. Mouth/Throat: Mucous membranes are moist.  Cardiovascular: Normal rate, regular rhythm. Grossly normal heart sounds.  2+ radial pulses bilaterally. Respiratory: Normal respiratory effort.  No retractions. Lungs with inspiratory and expiratory wheezing bilaterally. Gastrointestinal: Soft and  nontender. No distention. Musculoskeletal: No lower extremity tenderness nor edema.  Neurologic:  Normal speech and language. No gross focal neurologic deficits are appreciated.    ED Results / Procedures / Treatments   Labs (all labs ordered are listed, but only abnormal results are displayed) Labs Reviewed  BASIC METABOLIC PANEL - Abnormal; Notable for the following components:      Result Value   Glucose, Bld 110 (*)    All other components within normal limits  RESP PANEL BY RT-PCR (FLU A&B, COVID) ARPGX2  CBC  TROPONIN I (HIGH SENSITIVITY)  TROPONIN I (HIGH SENSITIVITY)     EKG  ED ECG REPORT I, 2028, the attending physician, personally viewed and interpreted this ECG.   Date: 08/05/2021  EKG Time: 20:30  Rate: 87  Rhythm: normal sinus rhythm  Axis: Normal  Intervals:none  ST&T Change: None  RADIOLOGY Chest x-ray reviewed by me with no infiltrate, edema, or effusion.  PROCEDURES:  Critical Care performed: No  Procedures   MEDICATIONS ORDERED IN ED: Medications  ipratropium-albuterol (DUONEB) 0.5-2.5 (3) MG/3ML nebulizer solution 3 mL (3 mLs Nebulization Given 08/05/21 2134)  predniSONE (DELTASONE) tablet 60 mg (60 mg Oral Given 08/05/21 2134)  IMPRESSION / MDM / ASSESSMENT AND PLAN / ED COURSE  I reviewed the triage vital signs and the nursing notes.                              41 y.o. male with past medical history of bipolar disorder and asthma who presents to the ED with 2 days of productive cough now with increasing difficulty breathing and tightness in the center of his chest.  Differential diagnosis includes, but is not limited to, ACS, PE, pneumonia, pneumothorax, asthma exacerbation, bronchitis, viral syndrome.  Patient nontoxic-appearing and in no acute distress, maintaining O2 sats on room air.  Lungs have wheezing bilaterally and suspect his tightness and difficulty breathing is due to asthma exacerbation.  Chest x-ray with no  infiltrate or edema, with atypical symptoms I doubt PE.  EKG shows no evidence of arrhythmia or ischemia and troponin is negative, doubt ACS given constant symptoms over the entire day today.  We will treat with duo nebs and steroids, reassess.  CBC and BMP are unremarkable, no anemia or electrolyte abnormality noted.  Wheezing is improved on reassessment and patient is appropriate for outpatient management.  He will be prescribed short course of steroids along with albuterol inhaler, was counseled to establish care with PCP.  He was counseled to return to the ED for new worsening symptoms, patient agrees with plan.       FINAL CLINICAL IMPRESSION(S) / ED DIAGNOSES   Final diagnoses:  Exacerbation of asthma, unspecified asthma severity, unspecified whether persistent     Rx / DC Orders   ED Discharge Orders          Ordered    predniSONE (DELTASONE) 20 MG tablet  Daily with breakfast        08/05/21 2226    albuterol (VENTOLIN HFA) 108 (90 Base) MCG/ACT inhaler  Every 6 hours PRN       Note to Pharmacy: Please supply with spacer   08/05/21 2226             Note:  This document was prepared using Dragon voice recognition software and may include unintentional dictation errors.   Chesley Noon, MD 08/05/21 2227

## 2021-09-26 ENCOUNTER — Ambulatory Visit (HOSPITAL_COMMUNITY)
Admission: EM | Admit: 2021-09-26 | Discharge: 2021-09-26 | Disposition: A | Discharge status: Home / Self Care | Payer: Medicare Other | Attending: Nurse Practitioner | Admitting: Nurse Practitioner

## 2021-09-26 ENCOUNTER — Other Ambulatory Visit: Payer: Self-pay

## 2021-09-26 ENCOUNTER — Encounter (HOSPITAL_COMMUNITY): Payer: Self-pay

## 2021-09-26 DIAGNOSIS — R509 Fever, unspecified: Secondary | ICD-10-CM | POA: Diagnosis not present

## 2021-09-26 DIAGNOSIS — R059 Cough, unspecified: Secondary | ICD-10-CM | POA: Insufficient documentation

## 2021-09-26 DIAGNOSIS — J069 Acute upper respiratory infection, unspecified: Secondary | ICD-10-CM | POA: Diagnosis not present

## 2021-09-26 DIAGNOSIS — Z20822 Contact with and (suspected) exposure to covid-19: Secondary | ICD-10-CM | POA: Diagnosis not present

## 2021-09-26 LAB — POC INFLUENZA A AND B ANTIGEN (URGENT CARE ONLY)
INFLUENZA A ANTIGEN, POC: NEGATIVE
INFLUENZA B ANTIGEN, POC: NEGATIVE

## 2021-09-26 NOTE — Discharge Instructions (Addendum)
-   Influenza test today is negative ?- We will let you know with positive COVID results  ?-Your symptoms will most likely improve within 7 days.  If your symptoms last longer than 10 days and/or you start feeling worse with facial pain, high fever, cough, shortness of breath or start feeling significantly worse, please return for further evaluation.  ?Some things that can make you feel better are: ?- Increased rest ?- Increasing fluid with water/sugar free electrolytes ?- Acetaminophen as needed for fever/pain.  ?- Salt water gargling, chloraseptic spray and throat lozenges ?- OTC guaifenesin (Mucinex).  ?- Saline sinus flushes or a neti pot.  ?- Humidifying the air. ? ?

## 2021-09-26 NOTE — ED Triage Notes (Signed)
Pt present cough with fever and sore throat. Symptom started three days ago. ?

## 2021-09-26 NOTE — ED Provider Notes (Signed)
?[facility] ? ? ? ?CSN: [id number] ?Arrival date & time: 09/26/21  [id number] ? ? ?  ? ?History   ?Chief Complaint ?Chief Complaint  ?Patient presents with  ? Cough  ? Fever  ? ? ?HPI ?Gene Rivera is a 41 y.o. male.  ? ?Patient reports 3-day history of cough, congestion, tactile fever, occasional wheezing, decreased appetite, and fatigue.  He denies shortness of breath at rest, chest pain, chest tightness, sore throat, swollen glands, headache, ear pain or drainage, nausea, vomiting, diarrhea.  He has taken TheraFlu for his symptoms with mild relief.  He reports he is around his girlfriend's children who go to school and do not wear masks.  He is concerned he may have COVID or the flu. ? ? ? ?Past Medical History:  ?Diagnosis Date  ? Asthma   ? Bipolar affective disorder (HCC)   ? ? ?There are no problems to display for this patient. ? ? ?Past Surgical History:  ?Procedure Laterality Date  ? LOBECTOMY Left 1990  ? LUNG SURGERY    ? ? ? ? ? ?Home Medications   ? ?Prior to Admission medications   ?Medication Sig Start Date End Date Taking? Authorizing Provider  ?acetaminophen (TYLENOL) 500 MG tablet Take 1 tablet (500 mg total) by mouth every 6 (six) hours as needed. 08/28/20   Rhys MartiniGraham, Laura E, PA-C  ?albuterol (VENTOLIN HFA) 108 (90 Base) MCG/ACT inhaler Inhale 2 puffs into the lungs every 6 (six) hours as needed for wheezing or shortness of breath. 08/05/21   Chesley NoonJessup, Charles, MD  ?cetirizine (ZYRTEC ALLERGY) 10 MG tablet Take 1 tablet (10 mg total) by mouth daily. 03/02/21   Rushie Chestnutovington, Sarah M, PA-C  ?cyclobenzaprine (FLEXERIL) 5 MG tablet Take 1-2 tablets (5-10 mg total) by mouth 3 (three) times daily as needed for muscle spasms. 06/18/21   Evon SlackGaines, Thomas C, PA-C  ?famotidine (PEPCID) 20 MG tablet Take 1 tablet (20 mg total) by mouth 2 (two) times daily. 03/02/21   Rushie Chestnutovington, Sarah M, PA-C  ?ibuprofen (ADVIL) 800 MG tablet Take 1 tablet (800 mg total) by mouth 3 (three) times daily. 09/08/20   Mickie Bailate, Kelly H, NP   ?INVEGA SUSTENNA 234 MG/1.5ML SUSY injection Inject 234 mg into the muscle every 28 (twenty-eight) days.  10/13/18   [provider]  ?traZODone (DESYREL) 100 MG tablet Take 100 mg by mouth at bedtime.  11/01/18   [provider]  ?triamcinolone cream (KENALOG) 0.1 % Apply 1 application topically 2 (two) times daily. 03/02/21   Rushie Chestnutovington, Sarah M, PA-C  ? ? ?Family History ?Family History  ?Problem Relation Age of Onset  ? Diabetes Mother   ? Hypertension Mother   ? ? ?Social History ?Social History  ? ?Tobacco Use  ? Smoking status: Every Day  ?  Packs/day: 0.50  ?  Types: Cigarettes  ? Smokeless tobacco: Never  ?Vaping Use  ? Vaping Use: Never used  ?Substance Use Topics  ? Alcohol use: Yes  ?  Alcohol/week: 4.0 standard drinks  ?  Types: 4 Cans of beer per week  ? Drug use: Yes  ?  Types: Marijuana, Cocaine  ?  Comment: Denies current cocaine use  ? ? ? ?Allergies   ?Penicillins ? ? ?Review of Systems ?Review of Systems ?Per HPI ? ?Physical Exam ?Triage Vital Signs ?ED Triage Vitals [09/26/21 0820]  ?Enc Vitals Group  ?   BP 130/79  ?   Pulse Rate 88  ?   Resp 18  ?  Temp 98.8 ?F (37.1 ?C)  ?   Temp Source Oral  ?   SpO2 99 %  ?   Weight   ?   Height   ?   Head Circumference   ?   Peak Flow   ?   Pain Score 0  ?   Pain Loc   ?   Pain Edu?   ?   Excl. in GC?   ? ?No data found. ? ?Updated Vital Signs ?BP 130/79 (BP Location: Left Arm)   Pulse 88   Temp 98.8 ?F (37.1 ?C) (Oral)   Resp 18   SpO2 99%  ? ?Visual Acuity ?Right Eye Distance:   ?Left Eye Distance:   ?Bilateral Distance:   ? ?Right Eye Near:   ?Left Eye Near:    ?Bilateral Near:    ? ?Physical Exam ?Vitals and nursing note reviewed.  ?Constitutional:   ?   General: He is not in acute distress. ?   Appearance: Normal appearance. He is not ill-appearing or toxic-appearing.  ?HENT:  ?   Head: Normocephalic and atraumatic.  ?   Right Ear: Tympanic membrane, ear canal and external ear normal.  ?   Left Ear: Tympanic membrane, ear canal and  external ear normal.  ?   Nose: Congestion present. No rhinorrhea.  ?   Mouth/Throat:  ?   Mouth: Mucous membranes are moist.  ?   Pharynx: Oropharynx is clear. No oropharyngeal exudate or posterior oropharyngeal erythema.  ?Eyes:  ?   General: No scleral icterus. ?   Extraocular Movements: Extraocular movements intact.  ?Cardiovascular:  ?   Rate and Rhythm: Normal rate and regular rhythm.  ?Pulmonary:  ?   Effort: Pulmonary effort is normal. No respiratory distress.  ?   Breath sounds: Wheezing present. No rhonchi or rales.  ?Musculoskeletal:  ?   Cervical back: Normal range of motion and neck supple.  ?Lymphadenopathy:  ?   Cervical: No cervical adenopathy.  ?Skin: ?   General: Skin is warm and dry.  ?   Capillary Refill: Capillary refill takes less than 2 seconds.  ?   Coloration: Skin is not jaundiced or pale.  ?   Findings: No erythema or rash.  ?Neurological:  ?   Mental Status: He is alert and oriented to person, place, and time.  ?Psychiatric:     ?   Behavior: Behavior is cooperative.  ? ? ? ?UC Treatments / Results  ?Labs ?(all labs ordered are listed, but only abnormal results are displayed) ?Labs Reviewed  ?SARS CORONAVIRUS 2 (TAT 6-24 HRS)  ?POC INFLUENZA A AND B ANTIGEN (URGENT CARE ONLY)  ? ? ?EKG ? ? ?Radiology ?No results found. ? ?Procedures ?Procedures (including critical care time) ? ?Medications Ordered in UC ?Medications - No data to display ? ?Initial Impression / Assessment and Plan / UC Course  ?I have reviewed the triage vital signs and the nursing notes. ? ?Pertinent labs & imaging results that were available during my care of the patient were reviewed by me and considered in my medical decision making (see chart for details). ? ?  ?Symptoms are consistent with an upper respiratory viral infection.  Influenza test is negative today; will also test for COVID.  Notify with positive results.  Encouraged use of albuterol inhaler as needed for wheezing or shortness of breath.  He can also use  guaifenesin to help thin secretions, Tylenol/ibuprofen for fever or pain, nasal saline rinses, humidifier, steam showers.  If symptoms  persist more than 1 week, return to be reevaluated.  If symptoms worsen, go to emergency room. ?Final Clinical Impressions(s) / UC Diagnoses  ? ?Final diagnoses:  ?Viral URI with cough  ? ? ? ?Discharge Instructions   ? ?  ?- Influenza test today is negative ?- We will let you know with positive COVID results  ?-Your symptoms will most likely improve within 7 days.  If your symptoms last longer than 10 days and/or you start feeling worse with facial pain, high fever, cough, shortness of breath or start feeling significantly worse, please return for further evaluation.  ?Some things that can make you feel better are: ?- Increased rest ?- Increasing fluid with water/sugar free electrolytes ?- Acetaminophen as needed for fever/pain.  ?- Salt water gargling, chloraseptic spray and throat lozenges ?- OTC guaifenesin (Mucinex).  ?- Saline sinus flushes or a neti pot.  ?- Humidifying the air. ? ? ? ? ? ?ED Prescriptions   ?None ?  ? ?PDMP not reviewed this encounter. ?  ?Valentino Nose, NP ?09/26/21 [phone] ? ?

## 2021-09-27 LAB — SARS CORONAVIRUS 2 (TAT 6-24 HRS): SARS Coronavirus 2: NEGATIVE

## 2021-11-27 ENCOUNTER — Emergency Department: Payer: Medicare Other

## 2021-11-27 ENCOUNTER — Emergency Department
Admission: EM | Admit: 2021-11-27 | Discharge: 2021-11-28 | Disposition: A | Discharge status: Home / Self Care | Payer: Medicare Other | Attending: Emergency Medicine | Admitting: Emergency Medicine

## 2021-11-27 ENCOUNTER — Other Ambulatory Visit: Payer: Self-pay

## 2021-11-27 DIAGNOSIS — M542 Cervicalgia: Secondary | ICD-10-CM | POA: Insufficient documentation

## 2021-11-27 DIAGNOSIS — J45909 Unspecified asthma, uncomplicated: Secondary | ICD-10-CM | POA: Diagnosis not present

## 2021-11-27 DIAGNOSIS — M545 Low back pain, unspecified: Secondary | ICD-10-CM | POA: Insufficient documentation

## 2021-11-27 DIAGNOSIS — M549 Dorsalgia, unspecified: Secondary | ICD-10-CM

## 2021-11-27 DIAGNOSIS — M546 Pain in thoracic spine: Secondary | ICD-10-CM | POA: Diagnosis not present

## 2021-11-27 DIAGNOSIS — R202 Paresthesia of skin: Secondary | ICD-10-CM | POA: Diagnosis not present

## 2021-11-27 MED ORDER — TRAMADOL HCL 50 MG PO TABS
50.0000 mg | ORAL_TABLET | Freq: Once | ORAL | Status: AC
  Administered 2021-11-28: 50 mg via ORAL
  Filled 2021-11-27: qty 1

## 2021-11-27 MED ORDER — ACETAMINOPHEN 500 MG PO TABS
1000.0000 mg | ORAL_TABLET | Freq: Once | ORAL | Status: AC
  Administered 2021-11-28: 1000 mg via ORAL
  Filled 2021-11-27: qty 2
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
## 2021-11-28 ENCOUNTER — Emergency Department: Payer: Medicare Other

## 2021-11-28 DIAGNOSIS — M545 Low back pain, unspecified: Secondary | ICD-10-CM | POA: Diagnosis not present

## 2021-11-28 MED ORDER — CYCLOBENZAPRINE HCL 10 MG PO TABS: 10.0000 mg | ORAL_TABLET | Freq: Three times a day (TID) | ORAL | 0 refills | Status: DC | PRN

## 2021-11-28 MED ORDER — TRAMADOL HCL 50 MG PO TABS: 50.0000 mg | ORAL_TABLET | Freq: Four times a day (QID) | ORAL | 0 refills | Status: DC | PRN

## 2021-11-28 NOTE — Discharge Instructions (Signed)
You have been seen in the Emergency Department (ED)  today for back pain.  Back pain has many possible causes some are related to muscles while others have more serious causes. Even though you were checked carefully today and your exam and evaluation were reassuring, problems may develop later or continue to unfold. Therefore it is imperative that you follow up with doctor closely for further evaluation.  Follow-up with your doctor in 1 day for further evaluation.   When should you call for help?  Call your doctor now or seek immediate medical care if:  You have new or worsening numbness in your legs.  You have new or worsening weakness in your legs. (This could make it hard to stand up.)  You lose control of your bladder or bowels or if you are unable to urinate. You have numbness of your groin or buttock region If you develop a fever  Watch closely for changes in your health, and be sure to contact your doctor if:  Your pain gets worse.  You are not getting better after 2 weeks.  How can you care for yourself at home?  Take pain medicines exactly as directed.  If the doctor gave you a prescription medicine for pain, take it as prescribed.  If you are not taking a prescription pain medicine, ask your doctor if you can take an over-the-counter medicine like tylenol or ibuprofen. Sit or lie in positions that are most comfortable and reduce your pain. Try one of these positions when you lie down:  Lie on your back with your knees bent and supported by large pillows.  Lie on the floor with your legs on the seat of a sofa or chair.  Lie on your side with your knees and hips bent and a pillow between your legs.  Lie on your stomach if it does not make pain worse. Do not sit up in bed, and avoid soft couches and twisted positions. Bed rest can help relieve pain at first, but it delays healing. Avoid bed rest after the first day of back pain.  Change positions every 30 minutes. If you must sit  for long periods of time, take breaks from sitting. Get up and walk around, or lie in a comfortable position.  Try using a heating pad on a low or medium setting for 15 to 20 minutes every 2 or 3 hours. Try a warm shower in place of one session with the heating pad.  You can also try an ice pack for 10 to 15 minutes every 2 to 3 hours. Put a thin cloth between the ice pack and your skin.  Take short walks several times a day. You can start with 5 to 10 minutes, 3 or 4 times a day, and work up to longer walks. Walk on level surfaces and avoid hills and stairs until your back is better.  Return to work and other activities as soon as you can. Continued rest without activity is usually not good for your back.  To prevent future back pain, do exercises to stretch and strengthen your back and stomach. Learn how to use good posture, safe lifting techniques, and proper body mechanics.

## 2021-12-19 ENCOUNTER — Emergency Department
Admission: EM | Admit: 2021-12-19 | Discharge: 2021-12-19 | Disposition: A | Discharge status: Home / Self Care | Payer: Medicare Other | Attending: Emergency Medicine | Admitting: Emergency Medicine

## 2021-12-19 ENCOUNTER — Emergency Department: Payer: Medicare Other

## 2021-12-19 ENCOUNTER — Other Ambulatory Visit: Payer: Self-pay

## 2021-12-19 DIAGNOSIS — J45909 Unspecified asthma, uncomplicated: Secondary | ICD-10-CM | POA: Insufficient documentation

## 2021-12-19 DIAGNOSIS — M545 Low back pain, unspecified: Secondary | ICD-10-CM | POA: Diagnosis not present

## 2021-12-19 DIAGNOSIS — M779 Enthesopathy, unspecified: Secondary | ICD-10-CM | POA: Diagnosis not present

## 2021-12-19 MED ORDER — CYCLOBENZAPRINE HCL 10 MG PO TABS: 10.0000 mg | ORAL_TABLET | Freq: Three times a day (TID) | ORAL | 0 refills | Status: DC | PRN

## 2021-12-19 MED ORDER — MELOXICAM 15 MG PO TABS: 15.0000 mg | ORAL_TABLET | Freq: Every day | ORAL | 2 refills | Status: DC

## 2021-12-19 NOTE — ED Provider Notes (Addendum)
[facility] Provider Note    Event Date/Time   First MD Initiated Contact with Patient 12/19/21 [phone]     (approximate)   History   Back Pain   HPI  Gene Rivera is a 41 y.o. male presents to the ER for treatment and evaluation of low back pain x 3 days.  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  No specific injury but feels that this has contributed to his pain.  He has tried to rest but has had no relief of the pain.   Past Medical History:  Diagnosis Date   Asthma    Bipolar affective disorder [facility])      Physical Exam   Triage Vital Signs: ED Triage Vitals  Enc Vitals Group     BP 12/19/21 0909 130/85     Pulse Rate 12/19/21 0909 75     Resp 12/19/21 0909 17     Temp 12/19/21 0909 99.7 F (37.6 C)     Temp Source 12/19/21 0909 Oral     SpO2 12/19/21 0909 96 %     Weight 12/19/21 0918 169 lb 15.6 oz (77.1 kg)     Height 12/19/21 0910 5\' 9"  (1.753 m)     Head Circumference --      Peak Flow --      Pain Score 12/19/21 0910 8     Pain Loc --      Pain Edu? --      Excl. in GC? --     Most recent vital signs: Vitals:   12/19/21 0909  BP: 130/85  Pulse: 75  Resp: 17  Temp: 99.7 F (37.6 C)  SpO2: 96%    General: Awake, no distress.  CV:  Good peripheral perfusion.  Resp:  Normal effort.  Abd:  No distention.  Other:  No focal tenderness over the lumbar spine.   ED Results / Procedures / Treatments   Labs (all labs ordered are listed, but only abnormal results are displayed) Labs Reviewed - No data to display   EKG  Not indicated   RADIOLOGY  No acute bony abnormality of the lumbar spine. Radiology read indicates mild left SI joint spurring and minimal spurring anterior to the vertebral column at the T8 12 L1 level.  I have independently reviewed and interpreted imaging as well as reviewed report from radiology.  PROCEDURES:  Critical Care performed: No  Procedures   MEDICATIONS ORDERED IN  ED:  Medications - No data to display   IMPRESSION / MDM / ASSESSMENT AND PLAN / ED COURSE   I reviewed the triage vital signs and the nursing notes.  Differential diagnosis includes, but is not limited to: Musculoskeletal strain, disc disease, osteoarthritis  Patient's presentation is most consistent with acute, uncomplicated illness.  41 year old male presenting to the emergency department for treatment and evaluation of low back pain.  See HPI for further details.  X-rays consistent with bone spurs over the SI joints and T12/L1.  Plan will be to prescribe meloxicam and Flexeril and provide a work excuse for today and tomorrow.  He is to follow-up with orthopedics if not improving over the week.  If symptoms change or worsen and he is unable to see primary care or orthopedics he is to return to the emergency department.      FINAL CLINICAL IMPRESSION(S) / ED DIAGNOSES   Final diagnoses:  Bone spur of other site  Acute bilateral low back pain without sciatica  Rx / DC Orders   ED Discharge Orders          Ordered    meloxicam (MOBIC) 15 MG tablet  Daily        12/19/21 1028    cyclobenzaprine (FLEXERIL) 10 MG tablet  3 times daily PRN        12/19/21 1028             Note:  This document was prepared using Dragon voice recognition software and may include unintentional dictation errors.   Chinita Pester, FNP 12/19/21 1031    Chinita Pester, FNP 12/19/21 1031    Minna Antis, MD 12/19/21 1426

## 2021-12-19 NOTE — Discharge Instructions (Addendum)
Please follow up with orthopedics if not improving over the week.  Return to the ER for symptoms that change or worsen if unable to see ortho or primary care.

## 2021-12-19 NOTE — ED Triage Notes (Signed)
Patient to ER via [facility] with complaints of lower back pain, reports pain started three days ago. 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 Reports pain radiates into bilateral legs.

## 2022-01-05 ENCOUNTER — Other Ambulatory Visit: Payer: Self-pay

## 2022-01-05 ENCOUNTER — Emergency Department
Admission: EM | Admit: 2022-01-05 | Discharge: 2022-01-05 | Disposition: A | Discharge status: Home / Self Care | Payer: Medicare Other | Attending: Student in an Organized Health Care Education/Training Program | Admitting: Student in an Organized Health Care Education/Training Program

## 2022-01-05 ENCOUNTER — Emergency Department: Payer: Medicare Other

## 2022-01-05 DIAGNOSIS — J4521 Mild intermittent asthma with (acute) exacerbation: Secondary | ICD-10-CM | POA: Diagnosis not present

## 2022-01-05 DIAGNOSIS — R059 Cough, unspecified: Secondary | ICD-10-CM | POA: Diagnosis present

## 2022-01-05 MED ORDER — IPRATROPIUM-ALBUTEROL 0.5-2.5 (3) MG/3ML IN SOLN
3.0000 mL | Freq: Once | RESPIRATORY_TRACT | Status: AC
  Administered 2022-01-05: 3 mL via RESPIRATORY_TRACT
  Filled 2022-01-05: qty 3

## 2022-01-05 MED ORDER — PREDNISONE 50 MG PO TABS: ORAL_TABLET | ORAL | 0 refills | Status: DC

## 2022-01-05 MED ORDER — ALBUTEROL SULFATE HFA 108 (90 BASE) MCG/ACT IN AERS: 2.0000 | INHALATION_SPRAY | Freq: Four times a day (QID) | RESPIRATORY_TRACT | 2 refills | Status: DC | PRN

## 2022-01-05 MED ORDER — PREDNISONE 20 MG PO TABS
60.0000 mg | ORAL_TABLET | Freq: Once | ORAL | Status: AC
  Administered 2022-01-05: 60 mg via ORAL
  Filled 2022-01-05: qty 3
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
## 2022-01-05 NOTE — Discharge Instructions (Addendum)
You can take 2 puffs of albuterol every 6 hours as needed for wheezing. You can take 1 dose of prednisone daily for the next 5 days.

## 2022-01-16 ENCOUNTER — Other Ambulatory Visit: Payer: Self-pay

## 2022-01-16 ENCOUNTER — Emergency Department
Admission: EM | Admit: 2022-01-16 | Discharge: 2022-01-16 | Disposition: A | Discharge status: Home / Self Care | Payer: Medicare Other | Attending: Emergency Medicine | Admitting: Emergency Medicine

## 2022-01-16 DIAGNOSIS — K0889 Other specified disorders of teeth and supporting structures: Secondary | ICD-10-CM | POA: Diagnosis present

## 2022-01-16 DIAGNOSIS — K029 Dental caries, unspecified: Secondary | ICD-10-CM | POA: Insufficient documentation

## 2022-01-16 MED ORDER — CLINDAMYCIN HCL 300 MG PO CAPS: 300.0000 mg | ORAL_CAPSULE | Freq: Three times a day (TID) | ORAL | 0 refills | Status: AC

## 2022-01-16 MED ORDER — CHLORHEXIDINE GLUCONATE 0.12 % MT SOLN
15.0000 mL | Freq: Two times a day (BID) | OROMUCOSAL | 0 refills | Status: DC
Start: 2022-01-16 — End: 2022-05-02

## 2022-01-16 MED ORDER — KETOROLAC TROMETHAMINE 15 MG/ML IJ SOLN
15.0000 mg | Freq: Once | INTRAMUSCULAR | Status: AC
  Administered 2022-01-16: 15 mg via INTRAMUSCULAR
  Filled 2022-01-16: qty 1

## 2022-01-16 NOTE — ED Provider Notes (Signed)
[facility] Provider Note    Event Date/Time   First MD Initiated Contact with Patient 01/16/22 [phone]     (approximate)   History   Dental Pain   HPI  Gene Rivera is a 41 y.o. male who presents today for evaluation of right lower back molar pain since eating last night.  Patient reports that he is on the wait list to be seen by dentist at the health department.  He denies trouble swallowing.  He denies trouble breathing.  He reports that he has not had a fever or chills.  There are no problems to display for this patient.         Physical Exam   Triage Vital Signs: ED Triage Vitals [01/16/22 0852]  Enc Vitals Group     BP      Pulse      Resp      Temp      Temp src      SpO2      Weight      Height      Head Circumference      Peak Flow      Pain Score 10     Pain Loc      Pain Edu?      Excl. in GC?     Most recent vital signs: Vitals:   01/16/22 0856  BP: 122/81  Pulse: 88  Resp: 18  Temp: 98.2 F (36.8 C)  SpO2: 100%    Physical Exam Vitals and nursing note reviewed.  Constitutional:      General: Awake and alert. No acute distress.    Appearance: Normal appearance. The patient is normal weight.  HENT:     Head: Normocephalic and atraumatic.     Mouth: Mucous membranes are moist.  Poor dentition diffusely.  Dental decay noted to all molars with several missing teeth.  No gingival fluctuance.  No discharge.  Tenderness to right-sided inferior posterior molars.  No sublingual swelling.  No facial or neck swelling or erythema. Eyes:     General: PERRL. Normal EOMs        Right eye: No discharge.        Left eye: No discharge.     Conjunctiva/sclera: Conjunctivae normal.  Cardiovascular:     Rate and Rhythm: Normal rate and regular rhythm.     Pulses: Normal pulses.     Heart sounds: Normal heart sounds Pulmonary:     Effort: Pulmonary effort is normal. No respiratory distress.     Breath sounds: Normal breath  sounds.  Abdominal:     Abdomen is soft. There is no abdominal tenderness. No rebound or guarding. No distention. Musculoskeletal:        General: No swelling. Normal range of motion.     Cervical back: Normal range of motion and neck supple.  Skin:    General: Skin is warm and dry.     Capillary Refill: Capillary refill takes less than 2 seconds.     Findings: No rash.  Neurological:     Mental Status: The patient is awake and alert.      ED Results / Procedures / Treatments   Labs (all labs ordered are listed, but only abnormal results are displayed) Labs Reviewed - No data to display   EKG     RADIOLOGY     PROCEDURES:  Critical Care performed:   Procedures   MEDICATIONS ORDERED IN ED: Medications  ketorolac (TORADOL) 15  MG/ML injection 15 mg (15 mg Intramuscular Given 01/16/22 0913)     IMPRESSION / MDM / ASSESSMENT AND PLAN / ED COURSE  I reviewed the triage vital signs and the nursing notes.   Differential diagnosis includes, but is not limited to, dental decay, dental caries, pulpitis. Patient was evaluated in the emergency department for dental pain. Patient has diffuse dental decay and tenderness to right lower molars with poor dentition, I suspect dental caries/decay vs pulpitits. No gingival swelling or fluctuance concerning for gingival abscess.  No trismus, nuchal rigidity, neck pain, hot potato voice, uvular deviation or malocclusion to suggest deep space infection. No sublingual swelling concerning for Ludwig's angina.  Patient was started on antibiotics and chlorhexidine mouth rinse.  He declined dental block.  Patient was treated symptomatically in the emergency department. Discussed care plan, return precautions, and advised close outpatient follow-up with dentist. Patient agrees with plan of care.    Patient's presentation is most consistent with exacerbation of chronic illness.     FINAL CLINICAL IMPRESSION(S) / ED DIAGNOSES   Final  diagnoses:  Pain due to dental caries  Toothache     Rx / DC Orders   ED Discharge Orders          Ordered    clindamycin (CLEOCIN) 300 MG capsule  3 times daily        01/16/22 0903    chlorhexidine (PERIDEX) 0.12 % solution  2 times daily        01/16/22 9798             Note:  This document was prepared using Dragon voice recognition software and may include unintentional dictation errors.   Keturah Shavers 01/16/22 1507    Sharman Cheek, MD 01/19/22 0002

## 2022-01-16 NOTE — Discharge Instructions (Signed)
Take the medications as prescribed.  Please follow-up with a dentist.  Please return for any new, worsening, or change in symptoms or other concerns.

## 2022-01-16 NOTE — ED Triage Notes (Signed)
Pt c/o dental pain of the right back lower tooth since last night while eating.

## 2022-02-12 ENCOUNTER — Telehealth (HOSPITAL_COMMUNITY): Payer: Self-pay | Admitting: *Deleted

## 2022-02-12 NOTE — Telephone Encounter (Signed)
Checking Appt History

## 2022-02-20 ENCOUNTER — Telehealth (HOSPITAL_COMMUNITY): Payer: Self-pay

## 2022-02-20 NOTE — Telephone Encounter (Signed)
Pt had Medication Transportaion Exception Verification  letter sent here via fax. He is looking to establish care here @ [facility].Marland Kitchen Pt lives in [place] with [place] for [facility] Medicare.... I tried calling Pt to get more information from him... no Ans LVM. Also called Maximino Greenland who is listed as the Caseworker on the form... after a few calls I was able to speak with Misty Stanley which informed me that the form would only have to be filled out by a provider to give reason on why this pt need to be brought here to [facility] which is out of [place]. Misty Stanley then stated that she would reach out to the patient to explain these things to him & give me a call back.

## 2022-03-12 ENCOUNTER — Emergency Department
Admission: EM | Admit: 2022-03-12 | Discharge: 2022-03-12 | Disposition: A | Discharge status: Home / Self Care | Payer: Medicare Other | Attending: Emergency Medicine | Admitting: Emergency Medicine

## 2022-03-12 ENCOUNTER — Other Ambulatory Visit: Payer: Self-pay

## 2022-03-12 DIAGNOSIS — K029 Dental caries, unspecified: Secondary | ICD-10-CM | POA: Insufficient documentation

## 2022-03-12 DIAGNOSIS — K0889 Other specified disorders of teeth and supporting structures: Secondary | ICD-10-CM

## 2022-03-12 DIAGNOSIS — J45909 Unspecified asthma, uncomplicated: Secondary | ICD-10-CM | POA: Insufficient documentation

## 2022-03-12 MED ORDER — CLINDAMYCIN HCL 300 MG PO CAPS: 300.0000 mg | ORAL_CAPSULE | Freq: Three times a day (TID) | ORAL | 0 refills | Status: AC

## 2022-03-12 MED ORDER — NAPROXEN 500 MG PO TABS
250.0000 mg | ORAL_TABLET | Freq: Once | ORAL | Status: AC
  Administered 2022-03-12: 250 mg via ORAL
  Filled 2022-03-12: qty 1

## 2022-03-12 MED ORDER — CLINDAMYCIN HCL 150 MG PO CAPS
300.0000 mg | ORAL_CAPSULE | Freq: Once | ORAL | Status: AC
  Administered 2022-03-12: 300 mg via ORAL
  Filled 2022-03-12: qty 2

## 2022-03-12 MED ORDER — NAPROXEN 250 MG PO TABS: 500.0000 mg | ORAL_TABLET | Freq: Two times a day (BID) | ORAL | 0 refills | Status: AC

## 2022-03-12 NOTE — ED Notes (Signed)
Dc instructions and scripts reviewed with pt no questions or concerns at this time. Will follow up with dentist.  

## 2022-03-12 NOTE — ED Provider Notes (Signed)
[facility] Provider Note    Event Date/Time   First MD Initiated Contact with Patient 03/12/22 1057     (approximate)   History   Dental Pain   HPI  Gene Rivera is a 41 y.o. male with pmh of asthma and bipolar disorder who p/w dental pain.  Patient has poor dentition and needs to have multiple teeth pulled.  He is seeing a dentist on September 12 and oral surgeon on October 12.  Last night he developed pain on the right upper teeth.  Pain does not localize to a specific tooth.  It is a constant pain.  Feels better with cold but worse with warm.  Denies fevers chills.  No significant swelling.     Past Medical History:  Diagnosis Date   Asthma    Bipolar affective disorder (HCC)     There are no problems to display for this patient.    Physical Exam  Triage Vital Signs: ED Triage Vitals [03/12/22 1051]  Enc Vitals Group     BP 127/82     Pulse Rate (!) 56     Resp 19     Temp 97.7 F (36.5 C)     Temp Source Oral     SpO2 100 %     Weight 160 lb (72.6 kg)     Height 5\' 9"  (1.753 m)     Head Circumference      Peak Flow      Pain Score 10     Pain Loc      Pain Edu?      Excl. in GC?     Most recent vital signs: Vitals:   03/12/22 1051  BP: 127/82  Pulse: (!) 56  Resp: 19  Temp: 97.7 F (36.5 C)  SpO2: 100%     General: Awake, no distress.  CV:  Good peripheral perfusion.  Resp:  Normal effort.  Abd:  No distention.  Neuro:             Awake, Alert, Oriented x 3  Other:  Patient has poor dentition multiple missing teeth multiple dental caries, there is tenderness to palpation of the right maxillary canine premolar and several molars, gingivitis, there is a small focus of purulence gingiva above the right maxillary canine No swelling of the floor the mouth No trismus No facial swelling   ED Results / Procedures / Treatments  Labs (all labs ordered are listed, but only abnormal results are displayed) Labs Reviewed  - No data to display   EKG     RADIOLOGY    PROCEDURES:  Critical Care performed: No  Procedures   MEDICATIONS ORDERED IN ED: Medications  clindamycin (CLEOCIN) capsule 300 mg (has no administration in time range)  naproxen (NAPROSYN) tablet 250 mg (250 mg Oral Given 03/12/22 1111)     IMPRESSION / MDM / ASSESSMENT AND PLAN / ED COURSE  I reviewed the triage vital signs and the nursing notes.                              Patient's presentation is most consistent with acute, uncomplicated illness.  Differential diagnosis includes, but is not limited to, gingivitis, pulpitis, dental caries, periapical abscess, periodontal abscess  Patient is a 41 year old male with poor dentition who is going to have multiple teeth pulled who presents with pain in the right maxillary teeth.  Pain started last night.  On exam he has multiple missing teeth and dental caries.  Has tenderness to percussion of multiple teeth on the right maxillary quadrant.  There is a small focus of purulence on the gingiva but is not fluctuant and not amenable to I and D.  There is no significant facial swelling or trismus to suggest deep space infection.  No findings to suggest Ludwig's.  Given patient will not be seen by dentist for another 2 weeks will prescribe course of antibiotics.  He has history of what sounds like severe reaction to penicillin so will prescribe clindamycin.  I have also prescribed naproxen.       FINAL CLINICAL IMPRESSION(S) / ED DIAGNOSES   Final diagnoses:  Pain, dental     Rx / DC Orders   ED Discharge Orders          Ordered    clindamycin (CLEOCIN) 300 MG capsule  3 times daily        03/12/22 1108    naproxen (NAPROSYN) 250 MG tablet  2 times daily with meals        03/12/22 1108             Note:  This document was prepared using Dragon voice recognition software and may include unintentional dictation errors.   Georga Hacking, MD 03/12/22 [phone]

## 2022-03-12 NOTE — ED Notes (Signed)
First Nurse Note: Pt to ED via POV for Dental Pain since last night. Pt is in NAD.

## 2022-03-12 NOTE — Discharge Instructions (Signed)
Please follow-up with your dentist and oral surgeon as scheduled.  Please take the antibiotic 3 times a day for the next 7 days.  You can take the naproxen for pain twice a day.

## 2022-03-12 NOTE — ED Triage Notes (Signed)
Pt began having right upper dental pain last night. Pt knows he has several cavities. Pt has a dentist appt for September 12th. Pt has already had several teeth pulled.

## 2022-03-24 DIAGNOSIS — F32A Depression, unspecified: Secondary | ICD-10-CM | POA: Diagnosis not present

## 2022-03-29 ENCOUNTER — Emergency Department
Admission: EM | Admit: 2022-03-29 | Discharge: 2022-03-29 | Disposition: A | Discharge status: Home / Self Care | Payer: Medicare Other | Attending: Emergency Medicine | Admitting: Emergency Medicine

## 2022-03-29 ENCOUNTER — Encounter: Payer: Self-pay | Admitting: Emergency Medicine

## 2022-03-29 ENCOUNTER — Emergency Department: Payer: Medicare Other

## 2022-03-29 DIAGNOSIS — S39012A Strain of muscle, fascia and tendon of lower back, initial encounter: Secondary | ICD-10-CM | POA: Insufficient documentation

## 2022-03-29 DIAGNOSIS — J45909 Unspecified asthma, uncomplicated: Secondary | ICD-10-CM | POA: Insufficient documentation

## 2022-03-29 DIAGNOSIS — Y990 Civilian activity done for income or pay: Secondary | ICD-10-CM | POA: Insufficient documentation

## 2022-03-29 DIAGNOSIS — X500XXA Overexertion from strenuous movement or load, initial encounter: Secondary | ICD-10-CM | POA: Insufficient documentation

## 2022-03-29 DIAGNOSIS — S3992XA Unspecified injury of lower back, initial encounter: Secondary | ICD-10-CM | POA: Diagnosis present

## 2022-03-29 DIAGNOSIS — M545 Low back pain, unspecified: Secondary | ICD-10-CM | POA: Diagnosis not present

## 2022-03-29 MED ORDER — LIDOCAINE 5 % EX PTCH
1.0000 | MEDICATED_PATCH | Freq: Once | CUTANEOUS | Status: DC
  Administered 2022-03-29: 1 via TRANSDERMAL
  Filled 2022-03-29: qty 1

## 2022-03-29 MED ORDER — IBUPROFEN 800 MG PO TABS: 800.0000 mg | ORAL_TABLET | Freq: Three times a day (TID) | ORAL | 0 refills | Status: AC | PRN

## 2022-03-29 MED ORDER — LIDOCAINE 5 % EX PTCH: 1.0000 | MEDICATED_PATCH | CUTANEOUS | 0 refills | Status: AC

## 2022-03-29 MED ORDER — METHOCARBAMOL 500 MG PO TABS
500.0000 mg | ORAL_TABLET | Freq: Three times a day (TID) | ORAL | 0 refills | Status: DC | PRN
Start: 2022-03-29 — End: 2022-05-02

## 2022-03-29 MED ORDER — IBUPROFEN 800 MG PO TABS
800.0000 mg | ORAL_TABLET | Freq: Once | ORAL | Status: AC
  Administered 2022-03-29: 800 mg via ORAL
  Filled 2022-03-29: qty 1

## 2022-03-29 NOTE — ED Triage Notes (Signed)
Pt presents via POV with complaints of lower back pain starting tonight while at work. No meds taken PTA. Notes repetitive motion while at work. Denies any falls, injury, nor urinary sx.

## 2022-03-29 NOTE — Discharge Instructions (Addendum)
You may alternate Tylenol 1000 mg every 6 hours as needed for pain, fever and Ibuprofen 800 mg every 6-8 hours as needed for pain, fever.  Please take Ibuprofen with food.  Do not take more than 4000 mg of Tylenol (acetaminophen) in a 24 hour period.  You may alternate heat and ice to your back.  Please no lifting over 10 pounds for the next 2 to 3 days.  I recommend light walking and stretching to help with your back pain.  Your x-rays of your lower back today were normal.

## 2022-03-29 NOTE — ED Provider Notes (Signed)
[facility] Provider Note    Event Date/Time   First MD Initiated Contact with Patient 03/29/22 [phone]     (approximate)   History   Back Pain   HPI  Gene Rivera is a 41 y.o. male with history of bipolar disorder, asthma who presents to the emergency department complaints of lower back pain.  States he has been lifting heavy things at work but denies any other injury.  No numbness, tingling, bowel or bladder incontinence, urinary retention, fever.  No history of HIV, diabetes, cancer, IV drug use.  No previous back surgery or epidural injection.   History provided by patient.    Past Medical History:  Diagnosis Date   Asthma    Bipolar affective disorder [facility])     Past Surgical History:  Procedure Laterality Date   LOBECTOMY Left 1990   LUNG SURGERY      MEDICATIONS:  Prior to Admission medications   Medication Sig Start Date End Date Taking? Authorizing Provider  acetaminophen (TYLENOL) 500 MG tablet Take 1 tablet (500 mg total) by mouth every 6 (six) hours as needed. 08/28/20   Rhys Martini, PA-C  albuterol (VENTOLIN HFA) 108 (90 Base) MCG/ACT inhaler Inhale 2 puffs into the lungs every 6 (six) hours as needed for wheezing or shortness of breath. 01/05/22   Orvil Feil, PA-C  cetirizine (ZYRTEC ALLERGY) 10 MG tablet Take 1 tablet (10 mg total) by mouth daily. 03/02/21   Rushie Chestnut, PA-C  chlorhexidine (PERIDEX) 0.12 % solution Use as directed 15 mLs in the mouth or throat 2 (two) times daily. 01/16/22   Poggi, Herb Grays, PA-C  cyclobenzaprine (FLEXERIL) 10 MG tablet Take 1 tablet (10 mg total) by mouth 3 (three) times daily as needed. 12/19/21   Triplett, Rulon Eisenmenger B, FNP  famotidine (PEPCID) 20 MG tablet Take 1 tablet (20 mg total) by mouth 2 (two) times daily. 03/02/21   Rushie Chestnut, PA-C  INVEGA SUSTENNA 234 MG/1.5ML SUSY injection Inject 234 mg into the muscle every 28 (twenty-eight) days.  10/13/18   [provider]   traZODone (DESYREL) 100 MG tablet Take 100 mg by mouth at bedtime.  11/01/18   [provider]  triamcinolone cream (KENALOG) 0.1 % Apply 1 application topically 2 (two) times daily. 03/02/21   Rushie Chestnut, PA-C    Physical Exam   Triage Vital Signs: ED Triage Vitals [03/29/22 0431]  Enc Vitals Group     BP 119/74     Pulse Rate 77     Resp 20     Temp 98 F (36.7 C)     Temp Source Oral     SpO2 100 %     Weight 163 lb 2.3 oz (74 kg)     Height 5\' 9"  (1.753 m)     Head Circumference      Peak Flow      Pain Score 8     Pain Loc      Pain Edu?      Excl. in GC?     Most recent vital signs: Vitals:   03/29/22 0431  BP: 119/74  Pulse: 77  Resp: 20  Temp: 98 F (36.7 C)  SpO2: 100%    CONSTITUTIONAL: Alert and oriented and responds appropriately to questions. Well-appearing; well-nourished HEAD: Normocephalic, atraumatic EYES: Conjunctivae clear, pupils appear equal, sclera nonicteric ENT: normal nose; moist mucous membranes NECK: Supple, normal ROM CARD: RRR; S1 and S2 appreciated; no murmurs,  no clicks, no rubs, no gallops RESP: Normal chest excursion without splinting or tachypnea; breath sounds clear and equal bilaterally; no wheezes, no rhonchi, no rales, no hypoxia or respiratory distress, speaking full sentences ABD/GI: Normal bowel sounds; non-distended; soft, non-tender, no rebound, no guarding, no peritoneal signs BACK: The back appears normal tender throughout the lumbar spine including the paraspinal muscles.  There is no midline step-off or deformity.  No redness, warmth, soft tissue swelling, ecchymosis, rash or other lesions. EXT: Normal ROM in all joints; no deformity noted, no edema; no cyanosis SKIN: Normal color for age and race; warm; no rash on exposed skin NEURO: Moves all extremities equally, normal speech, normal sensation, no saddle anesthesia, normal gait, no hyperreflexia PSYCH: The patient's mood and manner are  appropriate.   ED Results / Procedures / Treatments   LABS: (all labs ordered are listed, but only abnormal results are displayed) Labs Reviewed - No data to display   EKG:   RADIOLOGY: My personal review and interpretation of imaging: X-ray of the lumbar spine shows no acute abnormality.  I have personally reviewed all radiology reports.   DG Lumbar Spine Complete  Result Date: 03/29/2022 CLINICAL DATA:  41 year old male with low back pain. No known injury. EXAM: LUMBAR SPINE - COMPLETE 4+ VIEW COMPARISON:  Lumbar radiographs 12/19/2021 and earlier. FINDINGS: Normal lumbar segmentation. Bone mineralization is within normal limits. Stable lumbar lordosis. Stable mild retrolisthesis of L5 on S1. No pars fracture. Visible sacrum and SI joints appear stable and intact. No acute osseous abnormality identified. Stable, preserved disc spaces. Incidental pelvic phleboliths. Negative visible bowel gas pattern. IMPRESSION: Stable and largely unremarkable radiographic appearance, no acute osseous abnormality identified in the lumbar spine. Electronically Signed   By: Genevie Ann M.D.   On: 03/29/2022 05:19     PROCEDURES:  Critical Care performed: No     Procedures    IMPRESSION / MDM / ASSESSMENT AND PLAN / ED COURSE  I reviewed the triage vital signs and the nursing notes.    Patient here with lower back pain with no red flag symptoms.     DIFFERENTIAL DIAGNOSIS (includes but not limited to):   SPECT lumbar strain, muscle spasm.  Doubt cauda equina, epidural abscess or hematoma, discitis or osteomyelitis, transverse myelitis, fracture, severe spinal stenosis.   Patient's presentation is most consistent with acute complicated illness / injury requiring diagnostic workup.   PLAN: X-ray obtained from triage and reviewed and interpreted by myself and the radiologist and shows no acute abnormality.  Patient drove himself to the emergency department.  Will give ibuprofen and Lidoderm  patch here.  Will discharge with Robaxin, ibuprofen and Lidoderm patch.  Recommended rest, stretching, alternating heat and ice.  Does not need any further emergent imaging of his back today.  Offered work note which she declines.   MEDICATIONS GIVEN IN ED: Medications  ibuprofen (ADVIL) tablet 800 mg (has no administration in time range)  lidocaine (LIDODERM) 5 % 1 patch (has no administration in time range)     ED COURSE:  At this time, I do not feel there is any life-threatening condition present. I reviewed all nursing notes, vitals, pertinent previous records.  All lab and urine results, EKGs, imaging ordered have been independently reviewed and interpreted by myself.  I reviewed all available radiology reports from any imaging ordered this visit.  Based on my assessment, I feel the patient is safe to be discharged home without further emergent workup and can continue workup as  an outpatient as needed. Discussed all findings, treatment plan as well as usual and customary return precautions.  They verbalize understanding and are comfortable with this plan.  Outpatient follow-up has been provided as needed.  All questions have been answered.    CONSULTS:  none   OUTSIDE RECORDS REVIEWED:  none       FINAL CLINICAL IMPRESSION(S) / ED DIAGNOSES   Final diagnoses:  Strain of lumbar region, initial encounter     Rx / DC Orders   ED Discharge Orders          Ordered    lidocaine (LIDODERM) 5 %  Every 24 hours        03/29/22 0527    methocarbamol (ROBAXIN) 500 MG tablet  Every 8 hours PRN        03/29/22 0527    ibuprofen (ADVIL) 800 MG tablet  Every 8 hours PRN        03/29/22 0527             Note:  This document was prepared using Dragon voice recognition software and may include unintentional dictation errors.   Eleanore Junio, Layla Maw, DO 03/29/22 [phone]

## 2022-04-09 ENCOUNTER — Emergency Department: Payer: Medicare Other

## 2022-04-09 ENCOUNTER — Emergency Department
Admission: EM | Admit: 2022-04-09 | Discharge: 2022-04-09 | Discharge status: Against Medical Advice | Payer: Medicare Other

## 2022-04-09 ENCOUNTER — Other Ambulatory Visit: Payer: Self-pay

## 2022-04-09 ENCOUNTER — Emergency Department
Admission: EM | Admit: 2022-04-09 | Discharge: 2022-04-09 | Disposition: A | Discharge status: Home / Self Care | Payer: Medicare Other | Attending: Emergency Medicine | Admitting: Emergency Medicine

## 2022-04-09 ENCOUNTER — Encounter: Payer: Self-pay | Admitting: Emergency Medicine

## 2022-04-09 DIAGNOSIS — S99921A Unspecified injury of right foot, initial encounter: Secondary | ICD-10-CM | POA: Diagnosis not present

## 2022-04-09 DIAGNOSIS — W240XXA Contact with lifting devices, not elsewhere classified, initial encounter: Secondary | ICD-10-CM | POA: Diagnosis not present

## 2022-04-09 DIAGNOSIS — L84 Corns and callosities: Secondary | ICD-10-CM | POA: Diagnosis not present

## 2022-04-09 DIAGNOSIS — S9031XA Contusion of right foot, initial encounter: Secondary | ICD-10-CM | POA: Diagnosis not present

## 2022-04-09 NOTE — ED Provider Triage Note (Signed)
Emergency Medicine Provider Triage Evaluation Note  Gene Rivera , a 41 y.o. male  was evaluated in triage.  Pt complains of right foot pain.  He reports turning his foot and kicking a stack of pallets at work.  He does not intend to file Worker's Comp.  Review of Systems  Positive: Right foot pain Negative: Fall or head injury  Physical Exam  BP 120/70 (BP Location: Right Arm)   Pulse 83   Temp 99.4 F (37.4 C) (Oral)   Resp 17   Ht 5\' 9"  (1.753 m)   Wt 72.6 kg   SpO2 99%   BMI 23.63 kg/m  Gen:   Awake, no distress   Resp:  Normal effort  MSK:   Moves extremities without difficulty  Other:    Medical Decision Making  Medically screening exam initiated at 8:07 PM.  Appropriate orders placed.  Gene Rivera was informed that the remainder of the evaluation will be completed by another provider, this initial triage assessment does not replace that evaluation, and the importance of remaining in the ED until their evaluation is complete.  Patient to the ED for evaluation of lateral right foot pain after he accidentally kicked a stack of pallets at work.   Gene Needles, PA-C 04/09/22 2249

## 2022-04-09 NOTE — ED Provider Notes (Signed)
[facility] Emergency Department Provider Note     Event Date/Time   First MD Initiated Contact with Patient 04/09/22 2034     (approximate)   History   Foot Injury   HPI  Gene Rivera is a 41 y.o. male presents to the ED for evaluation of lateral right foot pain.  He reports hitting his lateral foot including his fourth and fifth toes on a stack pallets at work.  He denies any other injury at this time.     Physical Exam   Triage Vital Signs: ED Triage Vitals  Enc Vitals Group     BP 04/09/22 2001 120/70     Pulse Rate 04/09/22 2001 83     Resp 04/09/22 2001 17     Temp 04/09/22 2001 99.4 F (37.4 C)     Temp Source 04/09/22 2001 Oral     SpO2 04/09/22 2001 99 %     Weight 04/09/22 2001 160 lb (72.6 kg)     Height 04/09/22 2001 5\' 9"  (1.753 m)     Head Circumference --      Peak Flow --      Pain Score 04/09/22 2003 8     Pain Loc --      Pain Edu? --      Excl. in [place]? --     Most recent vital signs: Vitals:   04/09/22 2001  BP: 120/70  Pulse: 83  Resp: 17  Temp: 99.4 F (37.4 C)  SpO2: 99%    General Awake, no distress. NAd CV:  Good peripheral perfusion.  RESP:  Normal effort.  ABD:  No distention.  MSK:  Right foot without obvious deformity or dislocation.  Normal ankle range of motion on exam.  Patient localizes pain to the lateral pinky.  He has a chronic appearing bunion to the IP.  No cellulitis or erythema is appreciated.  Patient also noted to have plantar surface calluses over the first and fifth MTP heads.   ED Results / Procedures / Treatments   Labs (all labs ordered are listed, but only abnormal results are displayed) Labs Reviewed - No data to display   EKG  RADIOLOGY  I personally viewed and evaluated these images as part of my medical decision making, as well as reviewing the written report by the radiologist.  ED Provider Interpretation: no acute findings  DG Foot Complete Right  Result  Date: 04/09/2022 CLINICAL DATA:  Foot run over by pallet EXAM: RIGHT FOOT COMPLETE - 3+ VIEW COMPARISON:  None Available. FINDINGS: There is no evidence of fracture or dislocation. There is no evidence of arthropathy or other focal bone abnormality. Soft tissues are unremarkable. IMPRESSION: Negative. Electronically Signed   By: Rolm Baptise M.D.   On: 04/09/2022 20:28     PROCEDURES:  Critical Care performed: No  Procedures   MEDICATIONS ORDERED IN ED: Medications - No data to display   IMPRESSION / MDM / [place] / ED COURSE  I reviewed the triage vital signs and the nursing notes.                              Differential diagnosis includes, but is not limited to, foot contusion, toe fracture, ankle sprain  Patient's presentation is most consistent with acute complicated illness / injury requiring diagnostic workup.  Patient to the ED for evaluation of lateral foot pain after he accidentally kicked  a stack of pallets at work.  Patient is evaluated for his complaints in the ED, found have a reassuring exam.  No evidence of any acute fracture or dislocation on x-rays.  Patient does however, have chronic appearing calluses and corns to the right foot.  Some tenderness to the area over the fifth digit corn secondary to contusion.  Patient's diagnosis is consistent with foot contusion, with unrelated corns and calluses.. Patient will be discharged home with directions to take OTC Tylenol or Motrin as needed. Patient is to follow up with podiatry as needed or otherwise directed. Patient is given ED precautions to return to the ED for any worsening or new symptoms.    FINAL CLINICAL IMPRESSION(S) / ED DIAGNOSES   Final diagnoses:  Contusion of right foot, initial encounter  Corns and callus     Rx / DC Orders   ED Discharge Orders     None        Note:  This document was prepared using Dragon voice recognition software and may include unintentional dictation  errors.    Lissa Hoard, PA-C 04/09/22 2252    Sharyn Creamer, MD 04/09/22 [phone]

## 2022-04-09 NOTE — Discharge Instructions (Signed)
Your exam and right foot x-ray are normal and reassuring at this time.  No signs of a serious injury.  You have some corns and calluses which are not related to injury.  You should follow-up with podiatry for ongoing symptoms.  Return to the ED if needed.

## 2022-04-09 NOTE — ED Triage Notes (Signed)
Patient ambulatory to triage with steady gait, without difficulty or distress noted; pt reports a pallet ran over his rt foot today; c/o persistent pain

## 2022-04-23 DIAGNOSIS — Z79899 Other long term (current) drug therapy: Secondary | ICD-10-CM | POA: Diagnosis not present

## 2022-04-23 DIAGNOSIS — Z5181 Encounter for therapeutic drug level monitoring: Secondary | ICD-10-CM | POA: Diagnosis not present

## 2022-05-02 ENCOUNTER — Encounter: Payer: Self-pay | Admitting: Nurse Practitioner

## 2022-05-02 ENCOUNTER — Emergency Department: Payer: Medicare Other

## 2022-05-02 ENCOUNTER — Ambulatory Visit (INDEPENDENT_AMBULATORY_CARE_PROVIDER_SITE_OTHER): Payer: Medicare Other | Admitting: Nurse Practitioner

## 2022-05-02 ENCOUNTER — Other Ambulatory Visit: Payer: Self-pay

## 2022-05-02 ENCOUNTER — Emergency Department
Admission: EM | Admit: 2022-05-02 | Discharge: 2022-05-02 | Disposition: A | Discharge status: Home / Self Care | Payer: Medicare Other | Attending: Emergency Medicine | Admitting: Emergency Medicine

## 2022-05-02 VITALS — BP 121/72 | HR 68 | Ht 69.0 in | Wt 155.5 lb

## 2022-05-02 DIAGNOSIS — L84 Corns and callosities: Secondary | ICD-10-CM | POA: Insufficient documentation

## 2022-05-02 DIAGNOSIS — M545 Low back pain, unspecified: Secondary | ICD-10-CM

## 2022-05-02 DIAGNOSIS — G8929 Other chronic pain: Secondary | ICD-10-CM | POA: Diagnosis not present

## 2022-05-02 DIAGNOSIS — F1099 Alcohol use, unspecified with unspecified alcohol-induced disorder: Secondary | ICD-10-CM | POA: Diagnosis not present

## 2022-05-02 DIAGNOSIS — J45909 Unspecified asthma, uncomplicated: Secondary | ICD-10-CM | POA: Diagnosis not present

## 2022-05-02 DIAGNOSIS — F1090 Alcohol use, unspecified, uncomplicated: Secondary | ICD-10-CM

## 2022-05-02 DIAGNOSIS — F319 Bipolar disorder, unspecified: Secondary | ICD-10-CM

## 2022-05-02 LAB — COMPREHENSIVE METABOLIC PANEL WITH GFR
ALT: 22 U/L (ref 0–44)
AST: 43 U/L — ABNORMAL HIGH (ref 15–41)
Albumin: 3.9 g/dL (ref 3.5–5.0)
Alkaline Phosphatase: 43 U/L (ref 38–126)
Anion gap: 9 (ref 5–15)
BUN: 8 mg/dL (ref 6–20)
CO2: 27 mmol/L (ref 22–32)
Calcium: 8.4 mg/dL — ABNORMAL LOW (ref 8.9–10.3)
Chloride: 100 mmol/L (ref 98–111)
Creatinine, Ser: 1.06 mg/dL (ref 0.61–1.24)
GFR, Estimated: >60 mL/min
Glucose, Bld: 87 mg/dL (ref 70–99)
Potassium: 4.2 mmol/L (ref 3.5–5.1)
Sodium: 136 mmol/L (ref 135–145)
Total Bilirubin: 0.9 mg/dL (ref 0.3–1.2)
Total Protein: 6.8 g/dL (ref 6.5–8.1)

## 2022-05-02 LAB — CBC WITH DIFFERENTIAL/PLATELET
Abs Immature Granulocytes: 0.02 K/uL (ref 0.00–0.07)
Basophils Absolute: 0.1 K/uL (ref 0.0–0.1)
Basophils Relative: 1 %
Eosinophils Absolute: 0.1 K/uL (ref 0.0–0.5)
Eosinophils Relative: 1 %
HCT: 46.2 % (ref 39.0–52.0)
Hemoglobin: 14.9 g/dL (ref 13.0–17.0)
Immature Granulocytes: 0 %
Lymphocytes Relative: 26 %
Lymphs Abs: 2.2 K/uL (ref 0.7–4.0)
MCH: 29.9 pg (ref 26.0–34.0)
MCHC: 32.3 g/dL (ref 30.0–36.0)
MCV: 92.8 fL (ref 80.0–100.0)
Monocytes Absolute: 0.7 K/uL (ref 0.1–1.0)
Monocytes Relative: 8 %
Neutro Abs: 5.4 K/uL (ref 1.7–7.7)
Neutrophils Relative %: 64 %
Platelets: 283 K/uL (ref 150–400)
RBC: 4.98 MIL/uL (ref 4.22–5.81)
RDW: 12.9 % (ref 11.5–15.5)
WBC: 8.4 K/uL (ref 4.0–10.5)
nRBC: 0 % (ref 0.0–0.2)

## 2022-05-02 MED ORDER — LIDOCAINE 5 % EX PTCH: 1.0000 | MEDICATED_PATCH | Freq: Two times a day (BID) | CUTANEOUS | 0 refills | Status: AC | PRN

## 2022-05-02 MED ORDER — LIDOCAINE 5 % EX PTCH
1.0000 | MEDICATED_PATCH | Freq: Once | CUTANEOUS | Status: DC
  Administered 2022-05-02: 1 via TRANSDERMAL
  Filled 2022-05-02: qty 1
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
## 2022-05-02 NOTE — Progress Notes (Signed)
New Patient Office Visit  Subjective    Patient ID: Gene Rivera, male    DOB: [birth year]  Age: 41 y.o. MRN: [id number]  CC:  Chief Complaint  Patient presents with   New Patient (Initial Visit)    HPI Gene Rivera presents to establish care. He have not been to the PCP in years. He has h/o asthma and Biploar. He is followed by a psychiatrist at [facility] and is taking paliperidone ER 6 mg and mirtazapine 15 mg.  He is also taking albuterol inhaler as needed. Patient state that he is home less from past 2-3 months before that he was living in with one of his friend.   He also complaint of foot callus bilaterally and back pain. Patient states that he used to lift heavy stuff at work  that caused his back pain.      Outpatient Encounter Medications as of 05/02/2022  Medication Sig   acetaminophen (TYLENOL) 500 MG tablet Take 1 tablet (500 mg total) by mouth every 6 (six) hours as needed.   albuterol (VENTOLIN HFA) 108 (90 Base) MCG/ACT inhaler Inhale 2 puffs into the lungs every 6 (six) hours as needed for wheezing or shortness of breath.   cetirizine (ZYRTEC ALLERGY) 10 MG tablet Take 1 tablet (10 mg total) by mouth daily.   ibuprofen (ADVIL) 800 MG tablet Take 1 tablet (800 mg total) by mouth every 8 (eight) hours as needed.   [DISCONTINUED] chlorhexidine (PERIDEX) 0.12 % solution Use as directed 15 mLs in the mouth or throat 2 (two) times daily.   [DISCONTINUED] famotidine (PEPCID) 20 MG tablet Take 1 tablet (20 mg total) by mouth 2 (two) times daily.   [DISCONTINUED] INVEGA SUSTENNA 234 MG/1.5ML SUSY injection Inject 234 mg into the muscle every 28 (twenty-eight) days.    [DISCONTINUED] methocarbamol (ROBAXIN) 500 MG tablet Take 1 tablet (500 mg total) by mouth every 8 (eight) hours as needed for muscle spasms.   [DISCONTINUED] traZODone (DESYREL) 100 MG tablet Take 100 mg by mouth at bedtime.    [DISCONTINUED] triamcinolone cream  (KENALOG) 0.1 % Apply 1 application topically 2 (two) times daily.   mirtazapine (REMERON) 15 MG tablet Take 15 mg by mouth at bedtime.   paliperidone (INVEGA) 6 MG 24 hr tablet Take 6 mg by mouth at bedtime.   No facility-administered encounter medications on file as of 05/02/2022.    Past Medical History:  Diagnosis Date   Asthma    Bipolar affective disorder [facility])     Past Surgical History:  Procedure Laterality Date   LOBECTOMY Left 07/14/1988   LUNG REMOVAL, PARTIAL Left    LUNG SURGERY      Family History  Problem Relation Age of Onset   Diabetes Mother    Hypertension Mother     Social History   Socioeconomic History   Marital status: Single    Spouse name: Not on file   Number of children: Not on file   Years of education: Not on file   Highest education level: Not on file  Occupational History   Not on file  Tobacco Use   Smoking status: Every Day    Packs/day: 0.50    Types: Cigarettes   Smokeless tobacco: Never  Vaping Use   Vaping Use: Never used  Substance and Sexual Activity   Alcohol use: Not Currently   Drug use: Not Currently    Types: Marijuana, Cocaine    Comment: Denies current cocaine use  Sexual activity: Yes  Other Topics Concern   Not on file  Social History Narrative   Not on file   Social Determinants of Health   Financial Resource Strain: Not on file  Food Insecurity: Not on file  Transportation Needs: Not on file  Physical Activity: Not on file  Stress: Not on file  Social Connections: Not on file  Intimate Partner Violence: Not on file    Review of Systems  Constitutional: Negative.   HENT: Negative.    Eyes: Negative.   Respiratory:  Negative for cough and shortness of breath.   Cardiovascular:  Negative for chest pain and leg swelling.  Gastrointestinal: Negative.   Genitourinary: Negative.   Musculoskeletal:  Positive for back pain.       Callus on the foot.  Skin: Negative.   Neurological:  Negative for  dizziness, tingling and headaches.        Objective    BP 121/72   Pulse 68   Ht 5\' 9"  (1.753 m)   Wt 155 lb 8 oz (70.5 kg)   BMI 22.96 kg/m   Physical Exam Constitutional:      Appearance: Normal appearance. He is normal weight.  HENT:     Right Ear: Tympanic membrane normal.     Left Ear: Tympanic membrane normal.     Nose: Nose normal.     Mouth/Throat:     Mouth: Mucous membranes are moist.     Pharynx: Oropharynx is clear.  Eyes:     Conjunctiva/sclera: Conjunctivae normal.     Pupils: Pupils are equal, round, and reactive to light.  Cardiovascular:     Rate and Rhythm: Normal rate and regular rhythm.     Pulses: Normal pulses.     Heart sounds: Normal heart sounds.  Abdominal:     General: Abdomen is flat. Bowel sounds are normal.     Palpations: Abdomen is soft. There is no mass.     Tenderness: There is no abdominal tenderness.  Musculoskeletal:     Comments: Planter calluses on the first and fifth MTP heads  Skin:    General: Skin is warm.  Neurological:     General: No focal deficit present.     Mental Status: He is alert and oriented to person, place, and time. Mental status is at baseline.  Psychiatric:        Mood and Affect: Mood normal.        Behavior: Behavior normal.        Thought Content: Thought content normal.        Judgment: Judgment normal.          Assessment & Plan:   Problem List Items Addressed This Visit       Respiratory   Mild asthma    Stable on medication. Continue albuterol inhaler as needed        Musculoskeletal and Integument   Callus    Callus on  feet bilaterally. Would refer him to podiatry      Relevant Orders   Ambulatory referral to Podiatry     Other   Bipolar disorder [facility]) - Primary    He is followed by psychiatrist. We will get the records from the psychiatrist.       Chronic bilateral low back pain without sciatica    Imaging done in the ED on 03/29/2022  and largely unremarkable  X-rays. Advised patient to use OTC Tylenol.      Relevant Medications   mirtazapine (REMERON)  15 MG tablet  I  Return in about 3 months (around 08/02/2022).   Theresia Lo, NP

## 2022-05-02 NOTE — Assessment & Plan Note (Signed)
He is followed by psychiatrist. We will get the records from the psychiatrist.

## 2022-05-02 NOTE — Assessment & Plan Note (Addendum)
Imaging done in the ED on 03/29/2022  and largely unremarkable X-rays. Advised patient to use OTC Tylenol.

## 2022-05-02 NOTE — Assessment & Plan Note (Signed)
Stable on medication. Continue albuterol inhaler as needed

## 2022-05-02 NOTE — Assessment & Plan Note (Signed)
Callus on  feet bilaterally. Would refer him to podiatry
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
## 2022-05-02 NOTE — Discharge Instructions (Addendum)
Your exam and labs are normal and reassuring at this time but no x-ray evidence of any acute fracture or dislocation.  Use the prescription lidocaine patches for pain relief.  May take over-the-counter Tylenol or Motrin as needed for acute pain.  Follow-up with your primary provider for ongoing symptoms.

## 2022-05-15 ENCOUNTER — Emergency Department
Admission: EM | Admit: 2022-05-15 | Discharge: 2022-05-15 | Disposition: A | Discharge status: Home / Self Care | Payer: Medicare Other | Attending: Emergency Medicine | Admitting: Emergency Medicine

## 2022-05-15 ENCOUNTER — Encounter: Payer: Self-pay | Admitting: Emergency Medicine

## 2022-05-15 DIAGNOSIS — Z1152 Encounter for screening for COVID-19: Secondary | ICD-10-CM | POA: Diagnosis not present

## 2022-05-15 DIAGNOSIS — M79671 Pain in right foot: Secondary | ICD-10-CM | POA: Diagnosis not present

## 2022-05-15 DIAGNOSIS — R438 Other disturbances of smell and taste: Secondary | ICD-10-CM | POA: Insufficient documentation

## 2022-05-15 DIAGNOSIS — R432 Parageusia: Secondary | ICD-10-CM

## 2022-05-15 LAB — SARS CORONAVIRUS 2 BY RT PCR: SARS Coronavirus 2 by RT PCR: NEGATIVE

## 2022-05-15 NOTE — ED Provider Notes (Signed)
   [facility] Provider Note    Event Date/Time   First MD Initiated Contact with Patient 05/15/22 [phone]     (approximate)   History   Foot Pain   HPI  Gene Rivera is a 41 y.o. male who presents to the emergency department today because of concerns for right foot pain as well as loss of taste.  In terms of the foot pain he states he has somewhat chronic history of issues with right foot pain particular when he walks.  Today he is more concerned because he has what appears to be some calluses on it.  Additionally the patient is concerned that he might have COVID because he feels like he has been having fevers and he has lost his taste.  He is not able to taste a cheeseburger that he had.  Patient denies any shortness of breath.     Physical Exam   Triage Vital Signs: ED Triage Vitals [05/15/22 0623]  Enc Vitals Group     BP 127/76     Pulse Rate 98     Resp 20     Temp 97.9 F (36.6 C)     Temp Source Oral     SpO2 98 %     Weight      Height      Head Circumference      Peak Flow      Pain Score      Pain Loc      Pain Edu?      Excl. in [place]?     Most recent vital signs: Vitals:   05/15/22 0623  BP: 127/76  Pulse: 98  Resp: 20  Temp: 97.9 F (36.6 C)  SpO2: 98%   General: Awake, alert, oriented. CV:  Good peripheral perfusion. Regular rate and rhythm. Resp:  Normal effort.  Abd:  No distention.  Other:  Calluses to the right foot. No ulcers. No erythema or swelling.   ED Results / Procedures / Treatments   Labs (all labs ordered are listed, but only abnormal results are displayed) Labs Reviewed  SARS CORONAVIRUS 2 BY RT PCR     EKG  None   RADIOLOGY None   PROCEDURES:  Critical Care performed: No  Procedures   MEDICATIONS ORDERED IN ED: Medications - No data to display   IMPRESSION / MDM / [place] / ED COURSE  I reviewed the triage vital signs and the nursing notes.                               Differential diagnosis includes, but is not limited to, covid, calluses.   Patient's presentation is most consistent with acute presentation with potential threat to life or bodily function.  Patient presented to the emergency department today because of concerns for both right foot pain as well as possible COVID.  On exam of his right foot there is no swelling or erythema.  He does have a couple calluses.  At this time I think it be reasonable for patient to follow-up with podiatry.  In terms of patient's concern for COVID we will check for COVID.  FINAL CLINICAL IMPRESSION(S) / ED DIAGNOSES   Final diagnoses:  Distorted taste  Foot pain, right     Note:  This document was prepared using Dragon voice recognition software and may include unintentional dictation errors.    Nance Pear, MD 05/15/22 [phone]

## 2022-05-15 NOTE — Discharge Instructions (Addendum)
Please follow up with podiatry.  Your covid test is negative.

## 2022-05-15 NOTE — ED Notes (Signed)
Rn to bedside to introduce self to pt. Pt is resting comfortably and in no acute distress.

## 2022-05-15 NOTE — ED Triage Notes (Signed)
Pt c/o bilateral foot pain as well as loss in taste and pt also requesting to have COVID testing.

## 2022-05-16 ENCOUNTER — Ambulatory Visit: Payer: Medicare Other | Admitting: Podiatry

## 2022-05-22 ENCOUNTER — Telehealth: Payer: Self-pay | Admitting: *Deleted

## 2022-05-22 NOTE — Telephone Encounter (Signed)
     Patient  visit on 05/15/2022  at [facility] was for covid 19 ?   Have you been able to follow up with your primary care physician?Patient is feeling better , and patient will not be reaching out to pc as feeling better   The patient was or was not able to obtain any needed medicine or equipment.  Are there diet recommendations that you are having difficulty following?  Patient expresses understanding of discharge instructions and education provided has no other needs at this time.   Yehuda Mao Greenauer -[facility], Population Health [phone] [address] [place] , [place] [address] Email : [email or web address]

## 2022-07-16 DIAGNOSIS — K0889 Other specified disorders of teeth and supporting structures: Secondary | ICD-10-CM | POA: Diagnosis not present

## 2022-07-25 ENCOUNTER — Ambulatory Visit: Payer: Medicare Other | Admitting: Nurse Practitioner

## 2022-08-08 ENCOUNTER — Emergency Department
Admission: EM | Admit: 2022-08-08 | Discharge: 2022-08-09 | Disposition: A | Discharge status: Home / Self Care | Payer: 59 | Attending: Emergency Medicine | Admitting: Emergency Medicine

## 2022-08-08 ENCOUNTER — Other Ambulatory Visit: Payer: Self-pay

## 2022-08-08 ENCOUNTER — Emergency Department: Payer: 59

## 2022-08-08 DIAGNOSIS — J4521 Mild intermittent asthma with (acute) exacerbation: Secondary | ICD-10-CM | POA: Diagnosis not present

## 2022-08-08 DIAGNOSIS — J069 Acute upper respiratory infection, unspecified: Secondary | ICD-10-CM

## 2022-08-08 DIAGNOSIS — B9789 Other viral agents as the cause of diseases classified elsewhere: Secondary | ICD-10-CM | POA: Diagnosis not present

## 2022-08-08 DIAGNOSIS — R0602 Shortness of breath: Secondary | ICD-10-CM | POA: Diagnosis not present

## 2022-08-08 DIAGNOSIS — Z20822 Contact with and (suspected) exposure to covid-19: Secondary | ICD-10-CM | POA: Insufficient documentation

## 2022-08-08 DIAGNOSIS — J4531 Mild persistent asthma with (acute) exacerbation: Secondary | ICD-10-CM

## 2022-08-08 DIAGNOSIS — J4541 Moderate persistent asthma with (acute) exacerbation: Secondary | ICD-10-CM | POA: Diagnosis not present

## 2022-08-08 DIAGNOSIS — R059 Cough, unspecified: Secondary | ICD-10-CM | POA: Diagnosis not present

## 2022-08-08 DIAGNOSIS — Z743 Need for continuous supervision: Secondary | ICD-10-CM | POA: Diagnosis not present

## 2022-08-08 DIAGNOSIS — R6889 Other general symptoms and signs: Secondary | ICD-10-CM | POA: Diagnosis not present

## 2022-08-08 LAB — RESP PANEL BY RT-PCR (RSV, FLU A&B, COVID)  RVPGX2
Influenza A by PCR: NEGATIVE
Influenza B by PCR: NEGATIVE
Resp Syncytial Virus by PCR: NEGATIVE
SARS Coronavirus 2 by RT PCR: NEGATIVE

## 2022-08-08 MED ORDER — IPRATROPIUM-ALBUTEROL 0.5-2.5 (3) MG/3ML IN SOLN
3.0000 mL | Freq: Once | RESPIRATORY_TRACT | Status: AC
  Administered 2022-08-09: 3 mL via RESPIRATORY_TRACT
  Filled 2022-08-08: qty 3

## 2022-08-08 MED ORDER — PREDNISONE 20 MG PO TABS
60.0000 mg | ORAL_TABLET | ORAL | Status: AC
  Administered 2022-08-09: 60 mg via ORAL
  Filled 2022-08-08: qty 3

## 2022-08-08 NOTE — ED Triage Notes (Signed)
Pt to ED from [facility] in [place] for SOB since this morning when he woke up. Pt has HX of asthma. Been without inhaler x 2 weeks. Persistent cough x 2 days.   114/58 60 95% RA 98.7

## 2022-08-08 NOTE — Discharge Instructions (Signed)
As we discussed, we believe you have a mild viral infection that is exacerbating your asthma.  Please fill the prescriptions provided tonight and use them according to label instructions.  Follow-up with your regular doctor at the next available opportunity.  Return to the emergency department if you develop new or worsening symptoms that concern you.

## 2022-08-09 DIAGNOSIS — J069 Acute upper respiratory infection, unspecified: Secondary | ICD-10-CM | POA: Diagnosis not present

## 2022-08-09 MED ORDER — PREDNISONE 10 MG PO TABS: ORAL_TABLET | ORAL | 0 refills | Status: DC

## 2022-08-09 MED ORDER — ALBUTEROL SULFATE HFA 108 (90 BASE) MCG/ACT IN AERS: INHALATION_SPRAY | RESPIRATORY_TRACT | 0 refills | Status: DC

## 2022-08-09 NOTE — ED Provider Notes (Signed)
[facility] Provider Note    Event Date/Time   First MD Initiated Contact with Patient 08/08/22 2315     (approximate)   History   Cough   HPI  Gene Rivera is a 42 y.o. male who reportedly has a history of asthma.  He also has a surgical history that includes partial lobectomy back in 1990.  He presents for evaluation of about 2 days of worsening shortness of breath and cough.  He said that he has not had his albuterol inhaler for quite some time.  He does not use it all the time, just if he gets sick and starts feeling short of breath and wheezing.  He is had a mild cough, nasal congestion, increased fatigue, and some generalized body aches.  Mild sore throat.  Some known sick contacts, and viral illnesses are very common in the area right now.  He denies any chest pain and abdominal pain.  No nausea, vomiting, nor diarrhea.  He says he has been eating and drinking well.  No known fever.     Physical Exam   Triage Vital Signs: ED Triage Vitals  Enc Vitals Group     BP 08/08/22 2244 104/63     Pulse Rate 08/08/22 2244 61     Resp 08/08/22 2244 16     Temp 08/08/22 2244 98.1 F (36.7 C)     Temp Source 08/08/22 2244 Oral     SpO2 08/08/22 2244 100 %     Weight 08/08/22 2245 77.1 kg (170 lb)     Height 08/08/22 2245 1.753 m (5\' 9" )     Head Circumference --      Peak Flow --      Pain Score 08/08/22 2246 0     Pain Loc --      Pain Edu? --      Excl. in [place]? --     Most recent vital signs: Vitals:   08/08/22 2244  BP: 104/63  Pulse: 61  Resp: 16  Temp: 98.1 F (36.7 C)  SpO2: 100%     General: Awake, no distress.  CV:  Good peripheral perfusion.  Normal heart sounds. Resp:  Normal effort. Speaking easily and comfortably, no accessory muscle usage nor intercostal retractions.  Lungs have some coarse breath sounds throughout and some expiratory wheezing. Abd:  No distention.    ED Results / Procedures / Treatments   Labs (all  labs ordered are listed, but only abnormal results are displayed) Labs Reviewed  RESP PANEL BY RT-PCR (RSV, FLU A&B, COVID)  [id number]     RADIOLOGY I viewed and interpreted the patient's two-view chest x-ray.  I see no evidence of lobar pneumonia or increased markings suggestive of a viral pattern or CHF.  I also read the radiologist's report, which confirmed no acute findings.    PROCEDURES:  Critical Care performed: No  Procedures   MEDICATIONS ORDERED IN ED: Medications  ipratropium-albuterol (DUONEB) 0.5-2.5 (3) MG/3ML nebulizer solution 3 mL (has no administration in time range)  predniSONE (DELTASONE) tablet 60 mg (has no administration in time range)     IMPRESSION / MDM / ASSESSMENT AND PLAN / ED COURSE  I reviewed the triage vital signs and the nursing notes.                              Differential diagnosis includes, but is not limited to, viral illness, community-acquired pneumonia,  asthma exacerbation, COPD, less likely PE.  Patient's presentation is most consistent with acute presentation with potential threat to life or bodily function.  Labs/studies ordered: Respiratory viral panel, two-view chest x-ray. Interventions/Medications given: DuoNeb, prednisone 60 mg p.o.  Vital signs are stable and within normal limits and the patient has an SpO2 of 100%.  His lung sounds are coarse with some expiratory wheezing consistent with his history of asthma and a mild exacerbation.  As document above, chest x-ray is clear.  Respiratory viral panel is negative.  I believe he has a nonspecific viral illness that has caused a mild asthma exacerbation and he has no albuterol at this time.  I wrote prescriptions for albuterol inhaler and a prednisone taper and he is comfortable with the plan for discharge and outpatient follow-up.  He felt better after getting a DuoNeb in the ED.  I gave my usual and customary return precautions.        FINAL CLINICAL IMPRESSION(S) / ED  DIAGNOSES   Final diagnoses:  Viral URI with cough  Mild persistent asthma with exacerbation     Rx / DC Orders   ED Discharge Orders          Ordered    albuterol (VENTOLIN HFA) 108 (90 Base) MCG/ACT inhaler       Note to Pharmacy: Pharmacy may substitute brand and size for insurance-approved equivalent   08/09/22 0000    predniSONE (DELTASONE) 10 MG tablet        08/09/22 0000             Note:  This document was prepared using Dragon voice recognition software and may include unintentional dictation errors.   Hinda Kehr, MD 08/09/22 [phone]

## 2022-08-18 ENCOUNTER — Ambulatory Visit (HOSPITAL_COMMUNITY)
Admission: EM | Admit: 2022-08-18 | Discharge: 2022-08-18 | Disposition: A | Discharge status: Home / Self Care | Payer: 59 | Attending: Physician Assistant | Admitting: Physician Assistant

## 2022-08-18 ENCOUNTER — Encounter (HOSPITAL_COMMUNITY): Payer: Self-pay

## 2022-08-18 ENCOUNTER — Telehealth (HOSPITAL_COMMUNITY): Payer: Self-pay

## 2022-08-18 DIAGNOSIS — J4521 Mild intermittent asthma with (acute) exacerbation: Secondary | ICD-10-CM

## 2022-08-18 MED ORDER — PREDNISONE 10 MG (21) PO TBPK: ORAL_TABLET | ORAL | 0 refills | Status: AC

## 2022-08-18 MED ORDER — PREDNISONE 10 MG (21) PO TBPK: ORAL_TABLET | ORAL | 0 refills | Status: DC

## 2022-08-18 MED ORDER — IPRATROPIUM-ALBUTEROL 0.5-2.5 (3) MG/3ML IN SOLN
RESPIRATORY_TRACT | Status: AC
  Filled 2022-08-18: qty 3

## 2022-08-18 MED ORDER — ALBUTEROL SULFATE HFA 108 (90 BASE) MCG/ACT IN AERS: INHALATION_SPRAY | RESPIRATORY_TRACT | 1 refills | Status: DC

## 2022-08-18 MED ORDER — ALBUTEROL SULFATE HFA 108 (90 BASE) MCG/ACT IN AERS: INHALATION_SPRAY | RESPIRATORY_TRACT | 1 refills | Status: AC

## 2022-08-18 MED ORDER — IPRATROPIUM-ALBUTEROL 0.5-2.5 (3) MG/3ML IN SOLN
3.0000 mL | Freq: Once | RESPIRATORY_TRACT | Status: AC
  Administered 2022-08-18: 3 mL via RESPIRATORY_TRACT

## 2022-08-18 NOTE — Discharge Instructions (Signed)
Use albuterol every 4-6 hours as needed for shortness of breath.  Start prednisone taper.  Do not take NSAIDs with this medication including aspirin, ibuprofen/Advil, naproxen/Aleve.  If your symptoms do not improve within a few days please return for reevaluation.  If you have any worsening or changing symptoms including shortness of breath, cough, fever, wheezing despite medication you should be seen immediately.  Follow-up with your primary care soon as possible as you may benefit from maintenance medication such as Symbicort or Advair.

## 2022-08-18 NOTE — ED Triage Notes (Signed)
Patient c/o a productive cough with yellow sputum and asthma x 2 days.  Patient states he is out of of his Albuterol inhaler.

## 2022-08-18 NOTE — ED Provider Notes (Signed)
[facility]    CSN: [id number] Arrival date & time: 08/18/22  1557      History   Chief Complaint Chief Complaint  Patient presents with   Cough   Asthma   Medication Refill    HPI Gene Rivera is a 42 y.o. male.   Patient presents today with a 2-day history of worsening asthma symptoms.  Reports productive cough, scratchy throat, shortness of breath, wheezing.  Denies any chest pain, nausea, vomiting, diarrhea, fever.  Denies any known sick contacts.  He does have a history of asthma generally managed with albuterol as needed.  He does not currently have this medication available.  He has not taken maintenance medication in the past.  Reports he has been hospitalized in the past but has never been intubated.  Denies any recent antibiotics but did just complete steroids.  Denies history of diabetes.  He has not tried any over-the-counter medication for symptom management.  He is having difficulty sleeping at night as result of symptoms.    Past Medical History:  Diagnosis Date   Asthma    Bipolar affective disorder [facility])     Patient Active Problem List   Diagnosis Date Noted   Callus 05/02/2022   Bipolar disorder ([place]) 05/02/2022   Mild asthma 05/02/2022   Chronic bilateral low back pain without sciatica 05/02/2022    Past Surgical History:  Procedure Laterality Date   LOBECTOMY Left 07/14/1988   LUNG REMOVAL, PARTIAL Left    LUNG SURGERY         Home Medications    Prior to Admission medications   Medication Sig Start Date End Date Taking? Authorizing Provider  predniSONE (STERAPRED UNI-PAK 21 TAB) 10 MG (21) TBPK tablet As directed 08/18/22  Yes Keri Tavella K, PA-C  acetaminophen (TYLENOL) 500 MG tablet Take 1 tablet (500 mg total) by mouth every 6 (six) hours as needed. 08/28/20   Hazel Sams, PA-C  albuterol (VENTOLIN HFA) 108 (90 Base) MCG/ACT inhaler Inhale 2-4 puffs by mouth every 4 hours as needed for wheezing, cough, and/or shortness of  breath 08/18/22   Areon Cocuzza K, PA-C  cetirizine (ZYRTEC ALLERGY) 10 MG tablet Take 1 tablet (10 mg total) by mouth daily. 03/02/21   Hughie Closs, PA-C  ibuprofen (ADVIL) 800 MG tablet Take 1 tablet (800 mg total) by mouth every 8 (eight) hours as needed. 03/29/22   Ward, Delice Bison, DO  mirtazapine (REMERON) 15 MG tablet Take 15 mg by mouth at bedtime. 04/23/22   [provider]  paliperidone (INVEGA) 6 MG 24 hr tablet Take 6 mg by mouth at bedtime. 04/25/22   [provider]    Family History Family History  Problem Relation Age of Onset   Diabetes Mother    Hypertension Mother     Social History Social History   Tobacco Use   Smoking status: Every Day    Packs/day: 0.50    Types: Cigarettes   Smokeless tobacco: Never  Vaping Use   Vaping Use: Never used  Substance Use Topics   Alcohol use: Not Currently   Drug use: Not Currently    Types: Marijuana, Cocaine    Comment: Denies current cocaine use     Allergies   Penicillins   Review of Systems Review of Systems  Constitutional:  Positive for activity change. Negative for appetite change, fatigue and fever.  HENT:  Positive for sore throat (scratchy from coughing). Negative for congestion, sinus pressure and sneezing.  Respiratory:  Positive for cough, chest tightness, shortness of breath and wheezing.   Cardiovascular:  Negative for chest pain.  Gastrointestinal:  Negative for abdominal pain, diarrhea, nausea and vomiting.     Physical Exam Triage Vital Signs ED Triage Vitals  Enc Vitals Group     BP 08/18/22 1731 111/70     Pulse Rate 08/18/22 1731 68     Resp 08/18/22 1731 16     Temp 08/18/22 1731 99.5 F (37.5 C)     Temp Source 08/18/22 1731 Oral     SpO2 08/18/22 1731 97 %     Weight --      Height --      Head Circumference --      Peak Flow --      Pain Score 08/18/22 1733 0     Pain Loc --      Pain Edu? --      Excl. in [place]? --    No data found.  Updated Vital  Signs BP 111/70 (BP Location: Right Arm)   Pulse 68   Temp 99.5 F (37.5 C) (Oral)   Resp 16   SpO2 97%   Visual Acuity Right Eye Distance:   Left Eye Distance:   Bilateral Distance:    Right Eye Near:   Left Eye Near:    Bilateral Near:     Physical Exam Vitals reviewed.  Constitutional:      General: He is awake.     Appearance: Normal appearance. He is well-developed. He is not ill-appearing.     Comments: Very pleasant male appears stated age in no acute distress sitting comfortably in exam room  HENT:     Head: Normocephalic and atraumatic.     Right Ear: Tympanic membrane, ear canal and external ear normal. Tympanic membrane is not erythematous or bulging.     Left Ear: Tympanic membrane, ear canal and external ear normal. Tympanic membrane is not erythematous or bulging.     Nose: Nose normal.     Mouth/Throat:     Pharynx: Uvula midline. No oropharyngeal exudate or posterior oropharyngeal erythema.  Cardiovascular:     Rate and Rhythm: Normal rate and regular rhythm.     Heart sounds: Normal heart sounds, S1 normal and S2 normal. No murmur heard. Pulmonary:     Effort: Pulmonary effort is normal. No accessory muscle usage or respiratory distress.     Breath sounds: No stridor. Wheezing present. No rhonchi or rales.     Comments: Widespread wheezing Abdominal:     General: Bowel sounds are normal.     Palpations: Abdomen is soft.     Tenderness: There is no abdominal tenderness.  Neurological:     Mental Status: He is alert.  Psychiatric:        Behavior: Behavior is cooperative.      UC Treatments / Results  Labs (all labs ordered are listed, but only abnormal results are displayed) Labs Reviewed - No data to display  EKG   Radiology No results found.  Procedures Procedures (including critical care time)  Medications Ordered in UC Medications  ipratropium-albuterol (DUONEB) 0.5-2.5 (3) MG/3ML nebulizer solution 3 mL (3 mLs Nebulization Given  08/18/22 1746)    Initial Impression / Assessment and Plan / UC Course  I have reviewed the triage vital signs and the nursing notes.  Pertinent labs & imaging results that were available during my care of the patient were reviewed by me and considered in my medical  decision making (see chart for details).     Patient is well-appearing, afebrile, nontoxic, nontachycardic.  No evidence of acute infection on physical exam that warrant initiation of antibiotics.  Symptoms are consistent with asthma exacerbation.  He had improvement following DuoNeb in clinic.  Offered injection of Solu-Medrol in clinic but patient declined this as he preferred oral tablets.  Will start prednisone taper tomorrow (08/19/2022) discussed that he is not to take NSAIDs with this medication due to risk of GI bleeding.  He can use over-the-counter medication including Mucinex, Flonase, Tylenol.  Albuterol inhaler was sent to pharmacy.  Discussed that given his recurrent symptoms if his symptoms do not resolve he should follow-up with his primary care to consider maintenance medication such as Symbicort or Advair.  Discussed that if his symptoms are improving within a few days he should return here or see his PCP.  If he has any worsening symptoms including shortness of breath, chest pain, fever, wheezing despite bronchodilator use he should be seen immediately.  Strict return precautions given.  Work excuse note provided.  Final Clinical Impressions(s) / UC Diagnoses   Final diagnoses:  Mild intermittent asthma with acute exacerbation     Discharge Instructions      Use albuterol every 4-6 hours as needed for shortness of breath.  Start prednisone taper.  Do not take NSAIDs with this medication including aspirin, ibuprofen/Advil, naproxen/Aleve.  If your symptoms do not improve within a few days please return for reevaluation.  If you have any worsening or changing symptoms including shortness of breath, cough, fever,  wheezing despite medication you should be seen immediately.  Follow-up with your primary care soon as possible as you may benefit from maintenance medication such as Symbicort or Advair.     ED Prescriptions     Medication Sig Dispense Auth. Provider   albuterol (VENTOLIN HFA) 108 (90 Base) MCG/ACT inhaler Inhale 2-4 puffs by mouth every 4 hours as needed for wheezing, cough, and/or shortness of breath 18 g Percell Lamboy K, PA-C   predniSONE (STERAPRED UNI-PAK 21 TAB) 10 MG (21) TBPK tablet As directed 21 tablet Tobiah Celestine K, PA-C      PDMP not reviewed this encounter.   Terrilee Croak, PA-C 08/18/22 1812

## 2022-08-22 ENCOUNTER — Telehealth: Payer: Self-pay | Admitting: *Deleted

## 2022-08-22 NOTE — Telephone Encounter (Unsigned)
     Patient  visit on 08/09/2022  at [facility] was for treatment   Patient just said doing good and hung up will pass on a case amanagement request  '  [facility] [address] , [place] [address] Email : [email or web address]

## 2022-09-20 DIAGNOSIS — M79671 Pain in right foot: Secondary | ICD-10-CM | POA: Diagnosis not present

## 2022-09-20 DIAGNOSIS — Z880 Allergy status to penicillin: Secondary | ICD-10-CM | POA: Diagnosis not present

## 2022-09-20 DIAGNOSIS — F17200 Nicotine dependence, unspecified, uncomplicated: Secondary | ICD-10-CM | POA: Diagnosis not present

## 2022-09-20 DIAGNOSIS — K0889 Other specified disorders of teeth and supporting structures: Secondary | ICD-10-CM | POA: Diagnosis not present

## 2022-09-30 DIAGNOSIS — R062 Wheezing: Secondary | ICD-10-CM | POA: Diagnosis not present

## 2022-09-30 DIAGNOSIS — F17210 Nicotine dependence, cigarettes, uncomplicated: Secondary | ICD-10-CM | POA: Diagnosis not present

## 2022-09-30 DIAGNOSIS — Z880 Allergy status to penicillin: Secondary | ICD-10-CM | POA: Diagnosis not present

## 2022-09-30 DIAGNOSIS — R918 Other nonspecific abnormal finding of lung field: Secondary | ICD-10-CM | POA: Diagnosis not present

## 2022-09-30 DIAGNOSIS — J449 Chronic obstructive pulmonary disease, unspecified: Secondary | ICD-10-CM | POA: Diagnosis not present

## 2022-11-04 DIAGNOSIS — Z20822 Contact with and (suspected) exposure to covid-19: Secondary | ICD-10-CM | POA: Diagnosis not present

## 2022-11-04 DIAGNOSIS — S4992XA Unspecified injury of left shoulder and upper arm, initial encounter: Secondary | ICD-10-CM | POA: Diagnosis not present

## 2022-11-04 DIAGNOSIS — M25512 Pain in left shoulder: Secondary | ICD-10-CM | POA: Diagnosis not present

## 2022-11-04 DIAGNOSIS — M19012 Primary osteoarthritis, left shoulder: Secondary | ICD-10-CM | POA: Diagnosis not present

## 2022-11-12 DIAGNOSIS — J9801 Acute bronchospasm: Secondary | ICD-10-CM | POA: Diagnosis not present

## 2023-01-08 ENCOUNTER — Telehealth: Payer: Self-pay | Admitting: *Deleted

## 2023-01-08 NOTE — Progress Notes (Signed)
  Care Coordination  Outreach Note  01/08/2023 Name: Gene Rivera MRN: [id number] DOB: Feb 26, 1981   Care Coordination Outreach Attempts: An unsuccessful telephone outreach was attempted today to offer the patient information about available care coordination services.  Follow Up Plan:  Additional outreach attempts will be made to offer the patient care coordination information and services.   Encounter Outcome:  No Answer  Christie Nottingham  Care Coordination Care Guide  Direct Dial: [phone]

## 2023-01-13 NOTE — Progress Notes (Signed)
  Care Coordination  Outreach Note  01/13/2023 Name: Gene Rivera MRN: [id number] DOB: 10-16-1980   Care Coordination Outreach Attempts: An unsuccessful telephone outreach was attempted today to offer the patient information about available care coordination services.  Follow Up Plan:  No further outreach attempts will be made at this time. We have been unable to contact the patient to offer or enroll patient in care coordination services  Encounter Outcome:  No Answer NON Working numbers unable to contact patient Christie Nottingham  Care Coordination Care Guide  Direct Dial: [phone]

## 2023-02-01 ENCOUNTER — Encounter: Payer: Self-pay | Admitting: Emergency Medicine

## 2023-02-01 ENCOUNTER — Emergency Department
Admission: EM | Admit: 2023-02-01 | Discharge: 2023-02-01 | Disposition: A | Discharge status: Home / Self Care | Payer: 59 | Attending: Emergency Medicine | Admitting: Emergency Medicine

## 2023-02-01 ENCOUNTER — Emergency Department: Payer: 59

## 2023-02-01 ENCOUNTER — Other Ambulatory Visit: Payer: Self-pay

## 2023-02-01 DIAGNOSIS — Z79899 Other long term (current) drug therapy: Secondary | ICD-10-CM | POA: Diagnosis not present

## 2023-02-01 DIAGNOSIS — S0990XA Unspecified injury of head, initial encounter: Secondary | ICD-10-CM | POA: Insufficient documentation

## 2023-02-01 DIAGNOSIS — Z760 Encounter for issue of repeat prescription: Secondary | ICD-10-CM | POA: Diagnosis not present

## 2023-02-01 DIAGNOSIS — R451 Restlessness and agitation: Secondary | ICD-10-CM | POA: Diagnosis not present

## 2023-02-01 DIAGNOSIS — R45850 Homicidal ideations: Secondary | ICD-10-CM | POA: Diagnosis not present

## 2023-02-01 DIAGNOSIS — M79641 Pain in right hand: Secondary | ICD-10-CM | POA: Diagnosis not present

## 2023-02-01 DIAGNOSIS — S6991XA Unspecified injury of right wrist, hand and finger(s), initial encounter: Secondary | ICD-10-CM | POA: Diagnosis not present

## 2023-02-01 DIAGNOSIS — R4689 Other symptoms and signs involving appearance and behavior: Secondary | ICD-10-CM

## 2023-02-01 DIAGNOSIS — F1090 Alcohol use, unspecified, uncomplicated: Secondary | ICD-10-CM | POA: Insufficient documentation

## 2023-02-01 DIAGNOSIS — R456 Violent behavior: Secondary | ICD-10-CM | POA: Diagnosis not present

## 2023-02-01 LAB — COMPREHENSIVE METABOLIC PANEL
ALT: 21 U/L (ref 0–44)
AST: 32 U/L (ref 15–41)
Albumin: 3.5 g/dL (ref 3.5–5.0)
Alkaline Phosphatase: 38 U/L (ref 38–126)
Anion gap: 10 (ref 5–15)
BUN: 17 mg/dL (ref 6–20)
CO2: 21 mmol/L — ABNORMAL LOW (ref 22–32)
Calcium: 8.5 mg/dL — ABNORMAL LOW (ref 8.9–10.3)
Chloride: 109 mmol/L (ref 98–111)
Creatinine, Ser: 1.25 mg/dL — ABNORMAL HIGH (ref 0.61–1.24)
GFR, Estimated: >60 mL/min (ref >60)
Glucose, Bld: 101 mg/dL — ABNORMAL HIGH (ref 70–99)
Potassium: 4.6 mmol/L (ref 3.5–5.1)
Sodium: 140 mmol/L (ref 135–145)
Total Bilirubin: 1.4 mg/dL — ABNORMAL HIGH (ref 0.3–1.2)
Total Protein: 7 g/dL (ref 6.5–8.1)

## 2023-02-01 LAB — CBC
HCT: 43.9 % (ref 39.0–52.0)
Hemoglobin: 14.6 g/dL (ref 13.0–17.0)
MCH: 30.5 pg (ref 26.0–34.0)
MCHC: 33.3 g/dL (ref 30.0–36.0)
MCV: 91.8 fL (ref 80.0–100.0)
Platelets: 251 10*3/uL (ref 150–400)
RBC: 4.78 MIL/uL (ref 4.22–5.81)
RDW: 11.9 % (ref 11.5–15.5)
WBC: 9.3 10*3/uL (ref 4.0–10.5)
nRBC: 0 % (ref 0.0–0.2)

## 2023-02-01 LAB — URINE DRUG SCREEN, QUALITATIVE (ARMC ONLY)
Amphetamines, Ur Screen: NOT DETECTED
Barbiturates, Ur Screen: NOT DETECTED
Benzodiazepine, Ur Scrn: NOT DETECTED
Cannabinoid 50 Ng, Ur ~~LOC~~: POSITIVE — AB
Cocaine Metabolite,Ur ~~LOC~~: POSITIVE — AB
MDMA (Ecstasy)Ur Screen: NOT DETECTED
Methadone Scn, Ur: NOT DETECTED
Opiate, Ur Screen: NOT DETECTED
Phencyclidine (PCP) Ur S: NOT DETECTED
Tricyclic, Ur Screen: NOT DETECTED

## 2023-02-01 LAB — ETHANOL: Alcohol, Ethyl (B): <10 mg/dL (ref <10)

## 2023-02-01 LAB — SALICYLATE LEVEL: Salicylate Lvl: <7 mg/dL — ABNORMAL LOW (ref 7.0–30.0)

## 2023-02-01 LAB — ACETAMINOPHEN LEVEL: Acetaminophen (Tylenol), Serum: <10 ug/mL — ABNORMAL LOW (ref 10–30)

## 2023-02-01 MED ORDER — PALIPERIDONE ER 3 MG PO TB24
6.0000 mg | ORAL_TABLET | Freq: Every day | ORAL | Status: DC
  Administered 2023-02-01: 6 mg via ORAL
  Filled 2023-02-01: qty 2

## 2023-02-01 MED ORDER — MIRTAZAPINE 15 MG PO TABS
15.0000 mg | ORAL_TABLET | Freq: Every day | ORAL | Status: DC
  Administered 2023-02-01: 15 mg via ORAL
  Filled 2023-02-01: qty 1
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
## 2023-02-01 NOTE — ED Notes (Signed)
Pt to the front desk to have his arm band removed. Primary RN aware of the patient leaving the department.
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
## 2023-02-01 NOTE — Consult Note (Incomplete)
[facility] Face-to-Face Psychiatry Consult   Reason for Consult:  *** Referring Physician:  *** Patient Identification: Gene Rivera MRN:  [id number] Principal Diagnosis: <principal problem not specified> Diagnosis:  Active Problems:   * No active hospital problems. *   Total Time spent with patient: {Time; 15 min - 8 hours:17441}  Subjective:   Gene Rivera is a 42 y.o. male patient admitted with ***.  HPI:  ***  Past Psychiatric History: ***  Risk to Self:   Risk to Others:   Prior Inpatient Therapy:   Prior Outpatient Therapy:    Past Medical History:  Past Medical History:  Diagnosis Date  . Asthma   . Bipolar affective disorder [facility])     Past Surgical History:  Procedure Laterality Date  . LOBECTOMY Left 07/14/1988  . LUNG REMOVAL, PARTIAL Left   . LUNG SURGERY     Family History:  Family History  Problem Relation Age of Onset  . Diabetes Mother   . Hypertension Mother    Family Psychiatric  History: *** Social History:  Social History   Substance and Sexual Activity  Alcohol Use Not Currently     Social History   Substance and Sexual Activity  Drug Use Not Currently  . Types: Marijuana, Cocaine   Comment: Denies current cocaine use    Social History   Socioeconomic History  . Marital status: Single    Spouse name: Not on file  . Number of children: Not on file  . Years of education: Not on file  . Highest education level: Not on file  Occupational History  . Not on file  Tobacco Use  . Smoking status: Every Day    Current packs/day: 0.50    Types: Cigarettes  . Smokeless tobacco: Never  Vaping Use  . Vaping status: Never Used  Substance and Sexual Activity  . Alcohol use: Not Currently  . Drug use: Not Currently    Types: Marijuana, Cocaine    Comment: Denies current cocaine use  . Sexual activity: Yes  Other Topics Concern  . Not on file  Social History Narrative  . Not on file   Social Determinants of Health   Financial  Resource Strain: Not on file  Food Insecurity: Not on file  Transportation Needs: Not on file  Physical Activity: Not on file  Stress: Not on file  Social Connections: Not on file   Additional Social History:    Allergies:   Allergies  Allergen Reactions  . Penicillins Hives, Itching and Other (See Comments)    Has patient had a PCN reaction causing immediate rash, facial/tongue/throat swelling, SOB or lightheadedness with hypotension: yes Has patient had a PCN reaction causing severe rash involving mucus membranes or skin necrosis: no Has patient had a PCN reaction that required hospitalization: yes Has patient had a PCN reaction occurring within the last 10 years: no If all of the above answers are "NO", then may proceed with Cephalosporin use.     Labs:  Results for orders placed or performed during the hospital encounter of 02/01/23 (from the past 48 hour(s))  Comprehensive metabolic panel     Status: Abnormal   Collection Time: 02/01/23  7:12 PM  Result Value Ref Range   Sodium 140 135 - 145 mmol/L   Potassium 4.6 3.5 - 5.1 mmol/L   Chloride 109 98 - 111 mmol/L   CO2 21 (L) 22 - 32 mmol/L   Glucose, Bld 101 (H) 70 - 99 mg/dL  Comment: Glucose reference range applies only to samples taken after fasting for at least 8 hours.   BUN 17 6 - 20 mg/dL   Creatinine, Ser 1.61 (H) 0.61 - 1.24 mg/dL   Calcium 8.5 (L) 8.9 - 10.3 mg/dL   Total Protein 7.0 6.5 - 8.1 g/dL   Albumin 3.5 3.5 - 5.0 g/dL   AST 32 15 - 41 U/L   ALT 21 0 - 44 U/L   Alkaline Phosphatase 38 38 - 126 U/L   Total Bilirubin 1.4 (H) 0.3 - 1.2 mg/dL   GFR, Estimated >09 >60 mL/min    Comment: (NOTE) Calculated using the CKD-EPI Creatinine Equation (2021)    Anion gap 10 5 - 15    Comment: Performed at [facility], [address] [place], [place] [address]  Ethanol     Status: None   Collection Time: 02/01/23  7:12 PM  Result Value Ref Range   Alcohol, Ethyl (B) <10 <10 mg/dL    Comment:  (NOTE) Lowest detectable limit for serum alcohol is 10 mg/dL.  For medical purposes only. Performed at [facility], [address] [place], [place] [address]   Salicylate level     Status: Abnormal   Collection Time: 02/01/23  7:12 PM  Result Value Ref Range   Salicylate Lvl <7.0 (L) 7.0 - 30.0 mg/dL    Comment: Performed at [facility], [address] [place], [place] [address]  Acetaminophen level     Status: Abnormal   Collection Time: 02/01/23  7:12 PM  Result Value Ref Range   Acetaminophen (Tylenol), Serum <10 (L) 10 - 30 ug/mL    Comment: (NOTE) Therapeutic concentrations vary significantly. A range of 10-30 ug/mL  may be an effective concentration for many patients. However, some  are best treated at concentrations outside of this range. Acetaminophen concentrations >150 ug/mL at 4 hours after ingestion  and >50 ug/mL at 12 hours after ingestion are often associated with  toxic reactions.  Performed at [facility], [address] [place], [place] [address]   Urine Drug Screen, Qualitative     Status: Abnormal   Collection Time: 02/01/23  7:12 PM  Result Value Ref Range   Tricyclic, Ur Screen NONE DETECTED NONE DETECTED   Amphetamines, Ur Screen NONE DETECTED NONE DETECTED   MDMA (Ecstasy)Ur Screen NONE DETECTED NONE DETECTED   Cocaine Metabolite,Ur [place] POSITIVE (A) NONE DETECTED   Opiate, Ur Screen NONE DETECTED NONE DETECTED   Phencyclidine (PCP) Ur S NONE DETECTED NONE DETECTED   Cannabinoid 50 Ng, Ur [place] POSITIVE (A) NONE DETECTED   Barbiturates, Ur Screen NONE DETECTED NONE DETECTED   Benzodiazepine, Ur Scrn NONE DETECTED NONE DETECTED   Methadone Scn, Ur NONE DETECTED NONE DETECTED    Comment: (NOTE) Tricyclics + metabolites, urine    Cutoff 1000 ng/mL Amphetamines + metabolites, urine  Cutoff 1000 ng/mL MDMA (Ecstasy), urine              Cutoff 500 ng/mL Cocaine Metabolite, urine          Cutoff 300 ng/mL Opiate +  metabolites, urine        Cutoff 300 ng/mL Phencyclidine (PCP), urine         Cutoff 25 ng/mL Cannabinoid, urine                 Cutoff 50 ng/mL Barbiturates + metabolites, urine  Cutoff 200 ng/mL Benzodiazepine, urine  Cutoff 200 ng/mL Methadone, urine                   Cutoff 300 ng/mL  The urine drug screen provides only a preliminary, unconfirmed analytical test result and should not be used for non-medical purposes. Clinical consideration and professional judgment should be applied to any positive drug screen result due to possible interfering substances. A more specific alternate chemical method must be used in order to obtain a confirmed analytical result. Gas chromatography / mass spectrometry (GC/MS) is the preferred confirm atory method. Performed at [facility], [address] [place], [place] [address]   CBC     Status: None   Collection Time: 02/01/23  8:20 PM  Result Value Ref Range   WBC 9.3 4.0 - 10.5 K/uL   RBC 4.78 4.22 - 5.81 MIL/uL   Hemoglobin 14.6 13.0 - 17.0 g/dL   HCT 25.4 27.0 - 62.3 %   MCV 91.8 80.0 - 100.0 fL   MCH 30.5 26.0 - 34.0 pg   MCHC 33.3 30.0 - 36.0 g/dL   RDW 76.2 83.1 - 51.7 %   Platelets 251 150 - 400 K/uL   nRBC 0.0 0.0 - 0.2 %    Comment: Performed at [facility], [address], [place], [place] [address]    Current Facility-Administered Medications  Medication Dose Route Frequency Provider Last Rate Last Admin  . mirtazapine (REMERON) tablet 15 mg  15 mg Oral QHS Merwyn Katos, MD      . paliperidone (INVEGA) 24 hr tablet 6 mg  6 mg Oral Daily Merwyn Katos, MD       Current Outpatient Medications  Medication Sig Dispense Refill  . acetaminophen (TYLENOL) 500 MG tablet Take 1 tablet (500 mg total) by mouth every 6 (six) hours as needed. 30 tablet 0  . albuterol (VENTOLIN HFA) 108 (90 Base) MCG/ACT inhaler Inhale 2-4 puffs by mouth every 4 hours as needed for wheezing, cough, and/or shortness  of breath 18 g 1  . cetirizine (ZYRTEC ALLERGY) 10 MG tablet Take 1 tablet (10 mg total) by mouth daily. 30 tablet 0  . ibuprofen (ADVIL) 800 MG tablet Take 1 tablet (800 mg total) by mouth every 8 (eight) hours as needed. 30 tablet 0  . mirtazapine (REMERON) 15 MG tablet Take 15 mg by mouth at bedtime.    . paliperidone (INVEGA) 6 MG 24 hr tablet Take 6 mg by mouth at bedtime.    . predniSONE (STERAPRED UNI-PAK 21 TAB) 10 MG (21) TBPK tablet As directed 21 tablet 0    Musculoskeletal: Strength & Muscle Tone: {desc; muscle tone:32375} Gait & Station: {PE GAIT ED PXTG:62694} Patient leans: {Patient Leans:21022755}            Psychiatric Specialty Exam:  Presentation  General Appearance: No data recorded Eye Contact:No data recorded Speech:No data recorded Speech Volume:No data recorded Handedness:No data recorded  Mood and Affect  Mood:No data recorded Affect:No data recorded  Thought Process  Thought Processes:No data recorded Descriptions of Associations:No data recorded Orientation:No data recorded Thought Content:No data recorded History of Schizophrenia/Schizoaffective disorder:No data recorded Duration of Psychotic Symptoms:No data recorded Hallucinations:No data recorded Ideas of Reference:No data recorded Suicidal Thoughts:No data recorded Homicidal Thoughts:No data recorded  Sensorium  Memory:No data recorded Judgment:No data recorded Insight:No data recorded  Executive Functions  Concentration:No data recorded Attention Span:No data recorded Recall:No data recorded Fund of Knowledge:No data recorded Language:No data recorded  Psychomotor Activity  Psychomotor Activity:No  data recorded  Assets  Assets:No data recorded  Sleep  Sleep:No data recorded  Physical Exam: Physical Exam Vitals reviewed.   ROS Blood pressure 113/89, pulse 80, temperature 98.3 F (36.8 C), temperature source Oral, resp. rate 18, height 5\' 9"  (1.753 m), weight  65.8 kg, SpO2 98%. Body mass index is 21.41 kg/m.  Treatment Plan Summary: {CHL [facility] MD TX JXBJ:[id number]}  Disposition: {CHL [facility] Consult HYQM:57846}  Jearld Lesch, NP 02/01/2023 10:01 PM

## 2023-02-01 NOTE — ED Notes (Signed)
Patient spoke with NP who told [facility] to [facility]'s who [facility] sees as an outpatient for medication refill. Patient received medication here during this ED visit. Patient was not happy and decided to leave without discharge papers.

## 2023-02-02 ENCOUNTER — Telehealth: Payer: Self-pay

## 2023-02-02 NOTE — Telephone Encounter (Signed)
Transition Care Management Unsuccessful Follow-up Telephone Call  Date of discharge and from where:  02/01/2023 [facility]  Attempts:  1st Attempt  Reason for unsuccessful TCM follow-up call:  No answer/busy  Xin Klawitter [facility]  [facility] Population Health Community Resource Care Guide   [email or web address]  ?? [phone]   Website: [email or web address]  [email or web address]

## 2023-02-03 ENCOUNTER — Telehealth: Payer: Self-pay

## 2023-02-03 NOTE — Telephone Encounter (Signed)
Transition Care Management Unsuccessful Follow-up Telephone Call  Date of discharge and from where:  02/01/2023 [facility]  Attempts:  2nd Attempt  Reason for unsuccessful TCM follow-up call:  Left voice message  Cerina Leary [facility]  [facility] Population Health Community Resource Care Guide   [email or web address]  ?? [phone]   Website: [email or web address]  [email or web address]

## 2023-03-02 DIAGNOSIS — Z9151 Personal history of suicidal behavior: Secondary | ICD-10-CM | POA: Diagnosis not present

## 2023-03-02 DIAGNOSIS — Z91148 Patient's other noncompliance with medication regimen for other reason: Secondary | ICD-10-CM | POA: Diagnosis not present

## 2023-03-02 DIAGNOSIS — Z5900 Homelessness unspecified: Secondary | ICD-10-CM | POA: Diagnosis not present

## 2023-05-08 DIAGNOSIS — J45909 Unspecified asthma, uncomplicated: Secondary | ICD-10-CM | POA: Diagnosis not present

## 2023-05-08 DIAGNOSIS — Z79899 Other long term (current) drug therapy: Secondary | ICD-10-CM | POA: Diagnosis not present

## 2023-05-08 DIAGNOSIS — F17210 Nicotine dependence, cigarettes, uncomplicated: Secondary | ICD-10-CM | POA: Diagnosis not present

## 2023-05-08 DIAGNOSIS — Z7951 Long term (current) use of inhaled steroids: Secondary | ICD-10-CM | POA: Diagnosis not present

## 2023-05-08 DIAGNOSIS — R059 Cough, unspecified: Secondary | ICD-10-CM | POA: Diagnosis not present
